# Patient Record
Sex: Female | Born: 1960 | Race: White | Hispanic: No | Marital: Married | Smoking: Current every day smoker
Health system: Southern US, Community
[De-identification: ages and names within clinical notes are randomized; demographics above are authoritative.]

## PROBLEM LIST (undated history)

## (undated) DIAGNOSIS — J449 Chronic obstructive pulmonary disease, unspecified: Secondary | ICD-10-CM

## (undated) DIAGNOSIS — I10 Essential (primary) hypertension: Secondary | ICD-10-CM

## (undated) DIAGNOSIS — E785 Hyperlipidemia, unspecified: Secondary | ICD-10-CM

## (undated) HISTORY — PX: ABDOMINAL HYSTERECTOMY: SHX81

## (undated) HISTORY — DX: Chronic obstructive pulmonary disease, unspecified: J44.9

## (undated) HISTORY — DX: Hyperlipidemia, unspecified: E78.5

## (undated) HISTORY — DX: Essential (primary) hypertension: I10

---

## 2005-06-01 ENCOUNTER — Emergency Department: Payer: Self-pay | Admitting: Emergency Medicine

## 2005-06-05 ENCOUNTER — Emergency Department: Payer: Self-pay | Admitting: Emergency Medicine

## 2006-03-26 ENCOUNTER — Emergency Department: Payer: Self-pay | Admitting: Internal Medicine

## 2006-03-26 ENCOUNTER — Other Ambulatory Visit: Payer: Self-pay

## 2006-05-24 ENCOUNTER — Emergency Department: Payer: Self-pay | Admitting: Emergency Medicine

## 2013-11-14 ENCOUNTER — Emergency Department: Payer: Self-pay | Admitting: Emergency Medicine

## 2017-09-21 ENCOUNTER — Other Ambulatory Visit: Payer: Self-pay | Admitting: Nurse Practitioner

## 2017-09-21 DIAGNOSIS — Z1231 Encounter for screening mammogram for malignant neoplasm of breast: Secondary | ICD-10-CM

## 2018-06-27 ENCOUNTER — Other Ambulatory Visit: Payer: Self-pay | Admitting: Nurse Practitioner

## 2018-06-27 DIAGNOSIS — Z1231 Encounter for screening mammogram for malignant neoplasm of breast: Secondary | ICD-10-CM

## 2018-07-14 ENCOUNTER — Encounter: Payer: Self-pay | Admitting: Radiology

## 2018-07-14 ENCOUNTER — Ambulatory Visit
Admission: RE | Admit: 2018-07-14 | Discharge: 2018-07-14 | Disposition: A | Discharge status: Home / Self Care | Payer: Medicare Other | Source: Ambulatory Visit | Attending: Nurse Practitioner | Admitting: Nurse Practitioner

## 2018-07-14 DIAGNOSIS — Z1231 Encounter for screening mammogram for malignant neoplasm of breast: Secondary | ICD-10-CM | POA: Insufficient documentation

## 2018-08-30 ENCOUNTER — Ambulatory Visit: Payer: Medicare Other | Admitting: Neurology

## 2019-05-04 ENCOUNTER — Other Ambulatory Visit: Payer: Self-pay | Admitting: Nurse Practitioner

## 2019-05-04 DIAGNOSIS — Z1231 Encounter for screening mammogram for malignant neoplasm of breast: Secondary | ICD-10-CM

## 2019-07-17 ENCOUNTER — Ambulatory Visit
Admission: RE | Admit: 2019-07-17 | Discharge: 2019-07-17 | Disposition: A | Discharge status: Home / Self Care | Payer: Medicare Other | Source: Ambulatory Visit | Attending: Nurse Practitioner | Admitting: Nurse Practitioner

## 2019-07-17 DIAGNOSIS — Z1231 Encounter for screening mammogram for malignant neoplasm of breast: Secondary | ICD-10-CM | POA: Insufficient documentation

## 2020-07-29 DIAGNOSIS — Z20822 Contact with and (suspected) exposure to covid-19: Secondary | ICD-10-CM | POA: Diagnosis not present

## 2020-08-11 DIAGNOSIS — R7303 Prediabetes: Secondary | ICD-10-CM | POA: Diagnosis not present

## 2020-08-11 DIAGNOSIS — E785 Hyperlipidemia, unspecified: Secondary | ICD-10-CM | POA: Diagnosis not present

## 2020-08-11 DIAGNOSIS — I10 Essential (primary) hypertension: Secondary | ICD-10-CM | POA: Diagnosis not present

## 2020-08-11 DIAGNOSIS — E559 Vitamin D deficiency, unspecified: Secondary | ICD-10-CM | POA: Diagnosis not present

## 2020-08-13 ENCOUNTER — Other Ambulatory Visit: Payer: Self-pay | Admitting: Nurse Practitioner

## 2020-08-13 DIAGNOSIS — I10 Essential (primary) hypertension: Secondary | ICD-10-CM | POA: Diagnosis not present

## 2020-08-13 DIAGNOSIS — Z1231 Encounter for screening mammogram for malignant neoplasm of breast: Secondary | ICD-10-CM

## 2020-08-13 DIAGNOSIS — E785 Hyperlipidemia, unspecified: Secondary | ICD-10-CM | POA: Diagnosis not present

## 2020-08-13 DIAGNOSIS — G43109 Migraine with aura, not intractable, without status migrainosus: Secondary | ICD-10-CM | POA: Diagnosis not present

## 2020-08-13 DIAGNOSIS — Z23 Encounter for immunization: Secondary | ICD-10-CM | POA: Diagnosis not present

## 2020-08-13 DIAGNOSIS — R7303 Prediabetes: Secondary | ICD-10-CM | POA: Diagnosis not present

## 2020-09-19 ENCOUNTER — Ambulatory Visit
Admission: RE | Admit: 2020-09-19 | Discharge: 2020-09-19 | Disposition: A | Discharge status: Home / Self Care | Payer: Medicare Other | Source: Ambulatory Visit | Attending: Nurse Practitioner | Admitting: Nurse Practitioner

## 2020-09-19 ENCOUNTER — Other Ambulatory Visit: Payer: Self-pay

## 2020-09-19 DIAGNOSIS — Z1231 Encounter for screening mammogram for malignant neoplasm of breast: Secondary | ICD-10-CM | POA: Insufficient documentation

## 2020-11-21 ENCOUNTER — Other Ambulatory Visit: Payer: Medicare Other

## 2020-12-09 DIAGNOSIS — I671 Cerebral aneurysm, nonruptured: Secondary | ICD-10-CM | POA: Diagnosis not present

## 2020-12-11 DIAGNOSIS — I671 Cerebral aneurysm, nonruptured: Secondary | ICD-10-CM | POA: Diagnosis not present

## 2020-12-12 DIAGNOSIS — E785 Hyperlipidemia, unspecified: Secondary | ICD-10-CM | POA: Diagnosis not present

## 2020-12-12 DIAGNOSIS — R7303 Prediabetes: Secondary | ICD-10-CM | POA: Diagnosis not present

## 2020-12-12 DIAGNOSIS — I10 Essential (primary) hypertension: Secondary | ICD-10-CM | POA: Diagnosis not present

## 2020-12-15 DIAGNOSIS — I671 Cerebral aneurysm, nonruptured: Secondary | ICD-10-CM | POA: Diagnosis not present

## 2020-12-15 DIAGNOSIS — I10 Essential (primary) hypertension: Secondary | ICD-10-CM | POA: Diagnosis not present

## 2020-12-15 DIAGNOSIS — G43009 Migraine without aura, not intractable, without status migrainosus: Secondary | ICD-10-CM | POA: Diagnosis not present

## 2020-12-15 DIAGNOSIS — E785 Hyperlipidemia, unspecified: Secondary | ICD-10-CM | POA: Diagnosis not present

## 2021-01-05 DIAGNOSIS — I10 Essential (primary) hypertension: Secondary | ICD-10-CM | POA: Diagnosis not present

## 2021-01-05 DIAGNOSIS — G4489 Other headache syndrome: Secondary | ICD-10-CM | POA: Diagnosis not present

## 2021-01-05 DIAGNOSIS — E785 Hyperlipidemia, unspecified: Secondary | ICD-10-CM | POA: Diagnosis not present

## 2021-01-05 DIAGNOSIS — Z0001 Encounter for general adult medical examination with abnormal findings: Secondary | ICD-10-CM | POA: Diagnosis not present

## 2021-06-16 DIAGNOSIS — E559 Vitamin D deficiency, unspecified: Secondary | ICD-10-CM | POA: Diagnosis not present

## 2021-06-16 DIAGNOSIS — E785 Hyperlipidemia, unspecified: Secondary | ICD-10-CM | POA: Diagnosis not present

## 2021-06-16 DIAGNOSIS — R7303 Prediabetes: Secondary | ICD-10-CM | POA: Diagnosis not present

## 2021-06-16 DIAGNOSIS — I10 Essential (primary) hypertension: Secondary | ICD-10-CM | POA: Diagnosis not present

## 2021-06-19 ENCOUNTER — Other Ambulatory Visit: Payer: Self-pay | Admitting: Nurse Practitioner

## 2021-06-19 DIAGNOSIS — I10 Essential (primary) hypertension: Secondary | ICD-10-CM | POA: Diagnosis not present

## 2021-06-19 DIAGNOSIS — G4489 Other headache syndrome: Secondary | ICD-10-CM | POA: Diagnosis not present

## 2021-06-19 DIAGNOSIS — Z1231 Encounter for screening mammogram for malignant neoplasm of breast: Secondary | ICD-10-CM

## 2021-06-19 DIAGNOSIS — E785 Hyperlipidemia, unspecified: Secondary | ICD-10-CM | POA: Diagnosis not present

## 2021-06-19 DIAGNOSIS — R7303 Prediabetes: Secondary | ICD-10-CM | POA: Diagnosis not present

## 2021-09-21 ENCOUNTER — Other Ambulatory Visit: Payer: Self-pay

## 2021-09-21 ENCOUNTER — Ambulatory Visit
Admission: RE | Admit: 2021-09-21 | Discharge: 2021-09-21 | Disposition: A | Discharge status: Home / Self Care | Payer: Medicare Other | Source: Ambulatory Visit | Attending: Nurse Practitioner | Admitting: Nurse Practitioner

## 2021-09-21 DIAGNOSIS — Z1231 Encounter for screening mammogram for malignant neoplasm of breast: Secondary | ICD-10-CM | POA: Insufficient documentation

## 2021-11-25 DIAGNOSIS — Z982 Presence of cerebrospinal fluid drainage device: Secondary | ICD-10-CM | POA: Diagnosis not present

## 2021-11-25 DIAGNOSIS — I6203 Nontraumatic chronic subdural hemorrhage: Secondary | ICD-10-CM | POA: Diagnosis not present

## 2021-11-25 DIAGNOSIS — G9389 Other specified disorders of brain: Secondary | ICD-10-CM | POA: Diagnosis not present

## 2021-11-25 DIAGNOSIS — I671 Cerebral aneurysm, nonruptured: Secondary | ICD-10-CM | POA: Diagnosis not present

## 2021-11-30 DIAGNOSIS — I671 Cerebral aneurysm, nonruptured: Secondary | ICD-10-CM | POA: Diagnosis not present

## 2021-12-06 DIAGNOSIS — E785 Hyperlipidemia, unspecified: Secondary | ICD-10-CM | POA: Diagnosis not present

## 2021-12-06 DIAGNOSIS — E559 Vitamin D deficiency, unspecified: Secondary | ICD-10-CM | POA: Diagnosis not present

## 2021-12-06 DIAGNOSIS — I10 Essential (primary) hypertension: Secondary | ICD-10-CM | POA: Diagnosis not present

## 2021-12-07 DIAGNOSIS — R7303 Prediabetes: Secondary | ICD-10-CM | POA: Diagnosis not present

## 2021-12-07 DIAGNOSIS — I10 Essential (primary) hypertension: Secondary | ICD-10-CM | POA: Diagnosis not present

## 2021-12-07 DIAGNOSIS — E785 Hyperlipidemia, unspecified: Secondary | ICD-10-CM | POA: Diagnosis not present

## 2021-12-29 DIAGNOSIS — E785 Hyperlipidemia, unspecified: Secondary | ICD-10-CM | POA: Diagnosis not present

## 2021-12-29 DIAGNOSIS — F17209 Nicotine dependence, unspecified, with unspecified nicotine-induced disorders: Secondary | ICD-10-CM | POA: Diagnosis not present

## 2021-12-29 DIAGNOSIS — R7303 Prediabetes: Secondary | ICD-10-CM | POA: Diagnosis not present

## 2021-12-29 DIAGNOSIS — I10 Essential (primary) hypertension: Secondary | ICD-10-CM | POA: Diagnosis not present

## 2022-01-27 DIAGNOSIS — E785 Hyperlipidemia, unspecified: Secondary | ICD-10-CM | POA: Diagnosis not present

## 2022-01-27 DIAGNOSIS — I10 Essential (primary) hypertension: Secondary | ICD-10-CM | POA: Diagnosis not present

## 2022-01-27 DIAGNOSIS — I671 Cerebral aneurysm, nonruptured: Secondary | ICD-10-CM | POA: Diagnosis not present

## 2022-01-27 DIAGNOSIS — R7303 Prediabetes: Secondary | ICD-10-CM | POA: Diagnosis not present

## 2022-01-27 DIAGNOSIS — F17209 Nicotine dependence, unspecified, with unspecified nicotine-induced disorders: Secondary | ICD-10-CM | POA: Diagnosis not present

## 2022-03-07 DIAGNOSIS — E785 Hyperlipidemia, unspecified: Secondary | ICD-10-CM | POA: Diagnosis not present

## 2022-03-07 DIAGNOSIS — I10 Essential (primary) hypertension: Secondary | ICD-10-CM | POA: Diagnosis not present

## 2022-03-07 DIAGNOSIS — E559 Vitamin D deficiency, unspecified: Secondary | ICD-10-CM | POA: Diagnosis not present

## 2022-04-12 DIAGNOSIS — I10 Essential (primary) hypertension: Secondary | ICD-10-CM | POA: Diagnosis not present

## 2022-04-12 DIAGNOSIS — R7303 Prediabetes: Secondary | ICD-10-CM | POA: Diagnosis not present

## 2022-04-12 DIAGNOSIS — E785 Hyperlipidemia, unspecified: Secondary | ICD-10-CM | POA: Diagnosis not present

## 2022-04-14 DIAGNOSIS — I10 Essential (primary) hypertension: Secondary | ICD-10-CM | POA: Diagnosis not present

## 2022-04-14 DIAGNOSIS — G4489 Other headache syndrome: Secondary | ICD-10-CM | POA: Diagnosis not present

## 2022-04-14 DIAGNOSIS — R5383 Other fatigue: Secondary | ICD-10-CM | POA: Diagnosis not present

## 2022-04-14 DIAGNOSIS — R7303 Prediabetes: Secondary | ICD-10-CM | POA: Diagnosis not present

## 2022-04-14 DIAGNOSIS — E785 Hyperlipidemia, unspecified: Secondary | ICD-10-CM | POA: Diagnosis not present

## 2022-08-13 DIAGNOSIS — R5383 Other fatigue: Secondary | ICD-10-CM | POA: Diagnosis not present

## 2022-08-13 DIAGNOSIS — R7303 Prediabetes: Secondary | ICD-10-CM | POA: Diagnosis not present

## 2022-08-13 DIAGNOSIS — I10 Essential (primary) hypertension: Secondary | ICD-10-CM | POA: Diagnosis not present

## 2022-08-13 DIAGNOSIS — E785 Hyperlipidemia, unspecified: Secondary | ICD-10-CM | POA: Diagnosis not present

## 2022-08-16 DIAGNOSIS — E785 Hyperlipidemia, unspecified: Secondary | ICD-10-CM | POA: Diagnosis not present

## 2022-08-16 DIAGNOSIS — Z23 Encounter for immunization: Secondary | ICD-10-CM | POA: Diagnosis not present

## 2022-08-16 DIAGNOSIS — I10 Essential (primary) hypertension: Secondary | ICD-10-CM | POA: Diagnosis not present

## 2022-08-16 DIAGNOSIS — R7303 Prediabetes: Secondary | ICD-10-CM | POA: Diagnosis not present

## 2022-08-16 DIAGNOSIS — F17209 Nicotine dependence, unspecified, with unspecified nicotine-induced disorders: Secondary | ICD-10-CM | POA: Diagnosis not present

## 2022-08-16 DIAGNOSIS — K219 Gastro-esophageal reflux disease without esophagitis: Secondary | ICD-10-CM | POA: Diagnosis not present

## 2022-08-17 ENCOUNTER — Other Ambulatory Visit: Payer: Self-pay | Admitting: Nurse Practitioner

## 2022-08-17 DIAGNOSIS — Z1231 Encounter for screening mammogram for malignant neoplasm of breast: Secondary | ICD-10-CM

## 2022-09-22 ENCOUNTER — Ambulatory Visit
Admission: RE | Admit: 2022-09-22 | Discharge: 2022-09-22 | Disposition: A | Discharge status: Home / Self Care | Payer: Medicare Other | Source: Ambulatory Visit | Attending: Nurse Practitioner | Admitting: Nurse Practitioner

## 2022-09-22 DIAGNOSIS — Z1231 Encounter for screening mammogram for malignant neoplasm of breast: Secondary | ICD-10-CM | POA: Diagnosis not present

## 2022-12-17 ENCOUNTER — Other Ambulatory Visit: Payer: Self-pay | Admitting: Nurse Practitioner

## 2022-12-28 ENCOUNTER — Other Ambulatory Visit: Payer: Self-pay | Admitting: Nurse Practitioner

## 2023-01-14 ENCOUNTER — Other Ambulatory Visit: Payer: 59

## 2023-01-14 ENCOUNTER — Other Ambulatory Visit: Payer: Self-pay | Admitting: Nurse Practitioner

## 2023-01-14 DIAGNOSIS — I10 Essential (primary) hypertension: Secondary | ICD-10-CM | POA: Diagnosis not present

## 2023-01-14 DIAGNOSIS — E785 Hyperlipidemia, unspecified: Secondary | ICD-10-CM | POA: Diagnosis not present

## 2023-01-14 DIAGNOSIS — R7303 Prediabetes: Secondary | ICD-10-CM | POA: Diagnosis not present

## 2023-01-15 LAB — COMPREHENSIVE METABOLIC PANEL
ALT: 16 IU/L (ref 0–32)
AST: 21 IU/L (ref 0–40)
Albumin/Globulin Ratio: 2.1 (ref 1.2–2.2)
Albumin: 4.4 g/dL (ref 3.9–4.9)
Alkaline Phosphatase: 67 IU/L (ref 44–121)
BUN/Creatinine Ratio: 11 — ABNORMAL LOW (ref 12–28)
BUN: 8 mg/dL (ref 8–27)
Bilirubin Total: 0.3 mg/dL (ref 0.0–1.2)
CO2: 24 mmol/L (ref 20–29)
Calcium: 9.4 mg/dL (ref 8.7–10.3)
Chloride: 103 mmol/L (ref 96–106)
Creatinine, Ser: 0.75 mg/dL (ref 0.57–1.00)
Globulin, Total: 2.1 g/dL (ref 1.5–4.5)
Glucose: 107 mg/dL — ABNORMAL HIGH (ref 70–99)
Potassium: 4.2 mmol/L (ref 3.5–5.2)
Sodium: 141 mmol/L (ref 134–144)
Total Protein: 6.5 g/dL (ref 6.0–8.5)
eGFR: 89 mL/min/{1.73_m2} (ref >59)

## 2023-01-15 LAB — HGB A1C W/O EAG: Hgb A1c MFr Bld: 6.1 % — ABNORMAL HIGH (ref 4.8–5.6)

## 2023-01-15 LAB — LIPID PANEL W/O CHOL/HDL RATIO
Cholesterol, Total: 106 mg/dL (ref 100–199)
HDL: 46 mg/dL (ref >39)
LDL Chol Calc (NIH): 44 mg/dL (ref 0–99)
Triglycerides: 82 mg/dL (ref 0–149)
VLDL Cholesterol Cal: 16 mg/dL (ref 5–40)

## 2023-01-15 LAB — TSH: TSH: 1.23 u[IU]/mL (ref 0.450–4.500)

## 2023-01-17 ENCOUNTER — Ambulatory Visit (INDEPENDENT_AMBULATORY_CARE_PROVIDER_SITE_OTHER): Payer: 59 | Admitting: Nurse Practitioner

## 2023-01-17 VITALS — BP 138/82 | HR 85 | Ht 66.0 in | Wt 119.0 lb

## 2023-01-17 DIAGNOSIS — F17209 Nicotine dependence, unspecified, with unspecified nicotine-induced disorders: Secondary | ICD-10-CM | POA: Diagnosis not present

## 2023-01-17 DIAGNOSIS — R7303 Prediabetes: Secondary | ICD-10-CM

## 2023-01-17 DIAGNOSIS — G8929 Other chronic pain: Secondary | ICD-10-CM | POA: Insufficient documentation

## 2023-01-17 DIAGNOSIS — R519 Headache, unspecified: Secondary | ICD-10-CM

## 2023-01-17 NOTE — Progress Notes (Signed)
Established Patient Office Visit  Subjective:  Patient ID: Beth Ray, female    DOB: 1961/05/13  Age: 62 y.o. MRN: [id number]  Chief Complaint  Patient presents with   Follow-up    5 Months with Labs    4 month follow up, reviewed fasting labs with patient and everything looks great, except for A1c at 6.1%.  Patient having headaches. Roselyn Meier was effective, now less effective.  Pt will call to NeuroSurg to see when next scan is due.     No past medical history on file.  Past Surgical History:  Procedure Laterality Date   ABDOMINAL HYSTERECTOMY      Social History   Socioeconomic History   Marital status: Married    Spouse name: Not on file   Number of children: Not on file   Years of education: Not on file   Highest education level: Not on file  Occupational History   Not on file  Tobacco Use   Smoking status: Not on file   Smokeless tobacco: Not on file  Substance and Sexual Activity   Alcohol use: Not on file   Drug use: Not on file   Sexual activity: Not on file  Other Topics Concern   Not on file  Social History Narrative   Not on file   Social Determinants of Health   Financial Resource Strain: Not on file  Food Insecurity: Not on file  Transportation Needs: Not on file  Physical Activity: Not on file  Stress: Not on file  Social Connections: Not on file  Intimate Partner Violence: Not on file    Family History  Problem Relation Age of Onset   Breast cancer Neg Hx     Not on File  Review of Systems  Constitutional: Negative.   HENT: Negative.    Eyes: Negative.   Respiratory: Negative.    Cardiovascular: Negative.   Gastrointestinal:  Positive for heartburn.  Genitourinary: Negative.   Musculoskeletal: Negative.   Skin: Negative.   Neurological:  Positive for headaches.  Endo/Heme/Allergies: Negative.   Psychiatric/Behavioral: Negative.         Objective:   BP 138/82   Pulse 85   Ht '5\' 6"'$  (1.676 m)   Wt 119 lb (54  kg)   SpO2 96%   BMI 19.21 kg/m   Vitals:   01/17/23 0930  BP: 138/82  Pulse: 85  Height: '5\' 6"'$  (1.676 m)  Weight: 119 lb (54 kg)  SpO2: 96%  BMI (Calculated): 19.22    Physical Exam Vitals reviewed.  Constitutional:      Appearance: Normal appearance.  HENT:     Head: Normocephalic.     Nose: Nose normal.     Mouth/Throat:     Mouth: Mucous membranes are moist.  Eyes:     Pupils: Pupils are equal, round, and reactive to light.  Cardiovascular:     Rate and Rhythm: Normal rate and regular rhythm.  Pulmonary:     Effort: Pulmonary effort is normal.     Breath sounds: Normal breath sounds.  Abdominal:     General: Bowel sounds are normal.     Palpations: Abdomen is soft.  Musculoskeletal:        General: Normal range of motion.     Cervical back: Normal range of motion and neck supple.  Skin:    General: Skin is warm and dry.  Neurological:     Mental Status: She is alert and oriented to person, place, and time.  Psychiatric:        Mood and Affect: Mood normal.        Behavior: Behavior normal.      No results found for any visits on 01/17/23.  Recent Results (from the past 2160 hour(s))  Comprehensive metabolic panel     Status: Abnormal   Collection Time: 01/14/23  8:39 AM  Result Value Ref Range   Glucose 107 (H) 70 - 99 mg/dL   BUN 8 8 - 27 mg/dL   Creatinine, Ser 0.75 0.57 - 1.00 mg/dL   eGFR 89 >59 mL/min/1.73   BUN/Creatinine Ratio 11 (L) 12 - 28   Sodium 141 134 - 144 mmol/L   Potassium 4.2 3.5 - 5.2 mmol/L   Chloride 103 96 - 106 mmol/L   CO2 24 20 - 29 mmol/L   Calcium 9.4 8.7 - 10.3 mg/dL   Total Protein 6.5 6.0 - 8.5 g/dL   Albumin 4.4 3.9 - 4.9 g/dL   Globulin, Total 2.1 1.5 - 4.5 g/dL   Albumin/Globulin Ratio 2.1 1.2 - 2.2   Bilirubin Total 0.3 0.0 - 1.2 mg/dL   Alkaline Phosphatase 67 44 - 121 IU/L   AST 21 0 - 40 IU/L   ALT 16 0 - 32 IU/L  Lipid Panel w/o Chol/HDL Ratio     Status: None   Collection Time: 01/14/23  8:39 AM   Result Value Ref Range   Cholesterol, Total 106 100 - 199 mg/dL   Triglycerides 82 0 - 149 mg/dL   HDL 46 >39 mg/dL   VLDL Cholesterol Cal 16 5 - 40 mg/dL   LDL Chol Calc (NIH) 44 0 - 99 mg/dL  Hgb A1c w/o eAG     Status: Abnormal   Collection Time: 01/14/23  8:39 AM  Result Value Ref Range   Hgb A1c MFr Bld 6.1 (H) 4.8 - 5.6 %    Comment:          Prediabetes: 5.7 - 6.4          Diabetes: >6.4          Glycemic control for adults with diabetes: <7.0   TSH     Status: None   Collection Time: 01/14/23  8:39 AM  Result Value Ref Range   TSH 1.230 0.450 - 4.500 uIU/mL      Assessment & Plan:   Problem List Items Addressed This Visit       Other   Chronic nonintractable headache - Primary   Relevant Medications   UBRELVY 50 MG TABS   Prediabetes   Nicotine dependence with nicotine-induced disorder    No follow-ups on file.   Total time spent: 35 minutes  Evern Bio, NP  01/17/2023

## 2023-01-17 NOTE — Patient Instructions (Signed)
1) Cut back on conc sweets and carbs 2) Pt will contact NeuroSurg for increased headaches 3) Follow up appt in 4 months, fasting labs prior

## 2023-04-25 DIAGNOSIS — I609 Nontraumatic subarachnoid hemorrhage, unspecified: Secondary | ICD-10-CM | POA: Diagnosis not present

## 2023-04-25 DIAGNOSIS — I671 Cerebral aneurysm, nonruptured: Secondary | ICD-10-CM | POA: Diagnosis not present

## 2023-05-16 ENCOUNTER — Other Ambulatory Visit: Payer: 59

## 2023-05-16 ENCOUNTER — Other Ambulatory Visit: Payer: Self-pay | Admitting: Nurse Practitioner

## 2023-05-16 DIAGNOSIS — I10 Essential (primary) hypertension: Secondary | ICD-10-CM | POA: Diagnosis not present

## 2023-05-16 DIAGNOSIS — R7303 Prediabetes: Secondary | ICD-10-CM | POA: Diagnosis not present

## 2023-05-16 DIAGNOSIS — E785 Hyperlipidemia, unspecified: Secondary | ICD-10-CM | POA: Diagnosis not present

## 2023-05-17 LAB — LIPID PANEL W/O CHOL/HDL RATIO
Cholesterol, Total: 113 mg/dL (ref 100–199)
HDL: 42 mg/dL (ref >39)
LDL Chol Calc (NIH): 51 mg/dL (ref 0–99)
Triglycerides: 107 mg/dL (ref 0–149)
VLDL Cholesterol Cal: 20 mg/dL (ref 5–40)

## 2023-05-17 LAB — COMPREHENSIVE METABOLIC PANEL
ALT: 15 IU/L (ref 0–32)
AST: 18 IU/L (ref 0–40)
Albumin: 4.3 g/dL (ref 3.9–4.9)
Alkaline Phosphatase: 66 IU/L (ref 44–121)
BUN/Creatinine Ratio: 11 — ABNORMAL LOW (ref 12–28)
BUN: 8 mg/dL (ref 8–27)
Bilirubin Total: 0.5 mg/dL (ref 0.0–1.2)
CO2: 24 mmol/L (ref 20–29)
Calcium: 9.7 mg/dL (ref 8.7–10.3)
Chloride: 106 mmol/L (ref 96–106)
Creatinine, Ser: 0.73 mg/dL (ref 0.57–1.00)
Globulin, Total: 1.9 g/dL (ref 1.5–4.5)
Glucose: 106 mg/dL — ABNORMAL HIGH (ref 70–99)
Potassium: 4.5 mmol/L (ref 3.5–5.2)
Sodium: 143 mmol/L (ref 134–144)
Total Protein: 6.2 g/dL (ref 6.0–8.5)
eGFR: 92 mL/min/{1.73_m2} (ref >59)

## 2023-05-17 LAB — HGB A1C W/O EAG: Hgb A1c MFr Bld: 6 % — ABNORMAL HIGH (ref 4.8–5.6)

## 2023-05-17 LAB — TSH: TSH: 1.04 u[IU]/mL (ref 0.450–4.500)

## 2023-05-19 ENCOUNTER — Ambulatory Visit (INDEPENDENT_AMBULATORY_CARE_PROVIDER_SITE_OTHER): Payer: 59 | Admitting: Cardiology

## 2023-05-19 ENCOUNTER — Encounter: Payer: Self-pay | Admitting: Cardiology

## 2023-05-19 VITALS — BP 116/74 | HR 82 | Ht 65.0 in | Wt 112.6 lb

## 2023-05-19 DIAGNOSIS — E782 Mixed hyperlipidemia: Secondary | ICD-10-CM

## 2023-05-19 DIAGNOSIS — I671 Cerebral aneurysm, nonruptured: Secondary | ICD-10-CM

## 2023-05-19 DIAGNOSIS — I10 Essential (primary) hypertension: Secondary | ICD-10-CM

## 2023-05-19 DIAGNOSIS — F17219 Nicotine dependence, cigarettes, with unspecified nicotine-induced disorders: Secondary | ICD-10-CM | POA: Diagnosis not present

## 2023-05-19 DIAGNOSIS — R7303 Prediabetes: Secondary | ICD-10-CM

## 2023-05-19 DIAGNOSIS — Z1211 Encounter for screening for malignant neoplasm of colon: Secondary | ICD-10-CM | POA: Diagnosis not present

## 2023-05-19 MED ORDER — GABAPENTIN 300 MG PO CAPS: 300.0000 mg | ORAL_CAPSULE | Freq: Every day | ORAL | 1 refills | Status: DC

## 2023-05-19 MED ORDER — ROSUVASTATIN CALCIUM 20 MG PO TABS: 20.0000 mg | ORAL_TABLET | Freq: Every day | ORAL | 1 refills | Status: DC

## 2023-05-19 MED ORDER — UBRELVY 100 MG PO TABS: 100.0000 mg | ORAL_TABLET | Freq: Every day | ORAL | 11 refills | Status: DC

## 2023-05-19 MED ORDER — CLOPIDOGREL BISULFATE 75 MG PO TABS: 75.0000 mg | ORAL_TABLET | Freq: Every day | ORAL | 1 refills | Status: DC

## 2023-05-19 NOTE — Progress Notes (Signed)
Established Patient Office Visit  Subjective:  Patient ID: Beth Ray, female    DOB: 1961/03/25  Age: 62 y.o. MRN: [id number]  Chief Complaint  Patient presents with   Follow-up    4 month follow up    Patient in office for 4 month follow up, discuss recent lab work. Patient doing well. Headaches better controlled. Seeing neurosurgery regularly and has a CT scan of the head scheduled for 05/22/2023. Other wise she is feeling well.  Recent blood work. Cholesterol well controlled, continue statin. TSH normal. Pre diabetic, patient to continue working on diet and exercise.  Patient continues to smoke. Discussed importance of quitting.  Mammogram 09/2022. Will order at next visit.  Cologuard 02/2019. Will order.    No other concerns at this time.   History reviewed. No pertinent past medical history.  Past Surgical History:  Procedure Laterality Date   ABDOMINAL HYSTERECTOMY      Social History   Socioeconomic History   Marital status: Married    Spouse name: Not on file   Number of children: Not on file   Years of education: Not on file   Highest education level: Not on file  Occupational History   Not on file  Tobacco Use   Smoking status: Every Day    Current packs/day: 1.00    Average packs/day: 1 pack/day for 49.5 years (49.5 ttl pk-yrs)    Types: Cigarettes    Start date: 84   Smokeless tobacco: Never  Substance and Sexual Activity   Alcohol use: Not on file   Drug use: Not on file   Sexual activity: Not on file  Other Topics Concern   Not on file  Social History Narrative   Not on file   Social Determinants of Health   Financial Resource Strain: Not on file  Food Insecurity: No Food Insecurity (09/13/2019)   Received from [facility]   Hunger Vital Sign    Worried About Running Out of Food in the Last Year: Never true    Ran Out of Food in the Last Year: Never true  Transportation Needs: Not on file  Physical Activity: Not on file   Stress: Not on file  Social Connections: Not on file  Intimate Partner Violence: Not on file    Family History  Problem Relation Age of Onset   Breast cancer Neg Hx     No Known Allergies  Review of Systems  Constitutional: Negative.   HENT: Negative.    Eyes: Negative.   Respiratory: Negative.  Negative for shortness of breath.   Cardiovascular: Negative.  Negative for chest pain.  Gastrointestinal: Negative.  Negative for abdominal pain, constipation and diarrhea.  Genitourinary: Negative.   Musculoskeletal:  Negative for joint pain and myalgias.  Skin: Negative.   Neurological: Negative.  Negative for dizziness and headaches.  Endo/Heme/Allergies: Negative.   All other systems reviewed and are negative.      Objective:   BP 116/74   Pulse 82   Ht 5\' 5"  (1.651 m)   Wt 112 lb 9.6 oz (51.1 kg)   SpO2 98%   BMI 18.74 kg/m   Vitals:   05/19/23 0902  BP: 116/74  Pulse: 82  Height: 5\' 5"  (1.651 m)  Weight: 112 lb 9.6 oz (51.1 kg)  SpO2: 98%  BMI (Calculated): 18.74    Physical Exam Vitals and nursing note reviewed.  Constitutional:      Appearance: Normal appearance. She is normal weight.  HENT:  Head: Normocephalic and atraumatic.     Nose: Nose normal.     Mouth/Throat:     Mouth: Mucous membranes are moist.     Pharynx: Oropharynx is clear.  Eyes:     Extraocular Movements: Extraocular movements intact.     Conjunctiva/sclera: Conjunctivae normal.     Pupils: Pupils are equal, round, and reactive to light.  Cardiovascular:     Rate and Rhythm: Normal rate and regular rhythm.     Pulses: Normal pulses.     Heart sounds: Normal heart sounds.  Pulmonary:     Effort: Pulmonary effort is normal.     Breath sounds: Normal breath sounds.  Abdominal:     General: Abdomen is flat. Bowel sounds are normal.     Palpations: Abdomen is soft.  Musculoskeletal:        General: Normal range of motion.     Cervical back: Normal range of motion.     Left  lower leg: No edema.  Skin:    General: Skin is warm and dry.  Neurological:     General: No focal deficit present.     Mental Status: She is alert and oriented to person, place, and time.  Psychiatric:        Mood and Affect: Mood normal.        Behavior: Behavior normal.        Thought Content: Thought content normal.        Judgment: Judgment normal.     No results found for any visits on 05/19/23.  Recent Results (from the past 2160 hour(s))  Comprehensive metabolic panel     Status: Abnormal   Collection Time: 05/16/23  8:40 AM  Result Value Ref Range   Glucose 106 (H) 70 - 99 mg/dL   BUN 8 8 - 27 mg/dL   Creatinine, Ser 5.18 0.57 - 1.00 mg/dL   eGFR 92 >84 ZY/SAY/3.01   BUN/Creatinine Ratio 11 (L) 12 - 28   Sodium 143 134 - 144 mmol/L   Potassium 4.5 3.5 - 5.2 mmol/L   Chloride 106 96 - 106 mmol/L   CO2 24 20 - 29 mmol/L   Calcium 9.7 8.7 - 10.3 mg/dL   Total Protein 6.2 6.0 - 8.5 g/dL   Albumin 4.3 3.9 - 4.9 g/dL   Globulin, Total 1.9 1.5 - 4.5 g/dL   Bilirubin Total 0.5 0.0 - 1.2 mg/dL   Alkaline Phosphatase 66 44 - 121 IU/L   AST 18 0 - 40 IU/L   ALT 15 0 - 32 IU/L  Lipid Panel w/o Chol/HDL Ratio     Status: None   Collection Time: 05/16/23  8:40 AM  Result Value Ref Range   Cholesterol, Total 113 100 - 199 mg/dL   Triglycerides 601 0 - 149 mg/dL   HDL 42 >09 mg/dL   VLDL Cholesterol Cal 20 5 - 40 mg/dL   LDL Chol Calc (NIH) 51 0 - 99 mg/dL  Hgb N2T w/o eAG     Status: Abnormal   Collection Time: 05/16/23  8:40 AM  Result Value Ref Range   Hgb A1c MFr Bld 6.0 (H) 4.8 - 5.6 %    Comment:          Prediabetes: 5.7 - 6.4          Diabetes: >6.4          Glycemic control for adults with diabetes: <7.0   TSH     Status: None   Collection Time:  05/16/23  8:40 AM  Result Value Ref Range   TSH 1.040 0.450 - 4.500 uIU/mL      Assessment & Plan:  Continue follow up with neurosurgeon.  Continue to work on diet and exercise.  Smoking cessation. Mammogram  at next visit. Cologuard ordered.   Problem List Items Addressed This Visit       Cardiovascular and Mediastinum   Cerebral aneurysm, nonruptured   Relevant Medications   rosuvastatin (CRESTOR) 20 MG tablet   Primary hypertension   Relevant Medications   rosuvastatin (CRESTOR) 20 MG tablet   Other Relevant Orders   CMP14+EGFR     Other   Prediabetes - Primary   Relevant Orders   TSH   Hemoglobin A1c   Nicotine dependence with nicotine-induced disorder   Mixed hyperlipidemia   Relevant Medications   rosuvastatin (CRESTOR) 20 MG tablet   Other Relevant Orders   Lipid Profile   TSH   Other Visit Diagnoses     Colon cancer screening       Relevant Orders   Cologuard       Return in about 4 months (around 09/19/2023) for with fasting blood work prior.   Total time spent: 25 minutes  [employer], NP  05/19/2023   This document may have been prepared by Dragon Voice Recognition software and as such may include unintentional dictation errors.

## 2023-05-22 DIAGNOSIS — I671 Cerebral aneurysm, nonruptured: Secondary | ICD-10-CM | POA: Diagnosis not present

## 2023-05-23 DIAGNOSIS — I671 Cerebral aneurysm, nonruptured: Secondary | ICD-10-CM | POA: Diagnosis not present

## 2023-05-30 IMAGING — MG MM DIGITAL SCREENING BILAT W/ TOMO AND CAD
8 series · 9 of 24 positions shown · non-contrast
Comparison: Previous exam(s).

CLINICAL DATA: Screening.

EXAM:
DIGITAL SCREENING BILATERAL MAMMOGRAM WITH TOMOSYNTHESIS AND CAD
TECHNIQUE: Bilateral screening digital craniocaudal and mediolateral oblique
mammograms were obtained. Bilateral screening digital breast
tomosynthesis was performed. The images were evaluated with
computer-aided detection.

[R CC synth-2D]
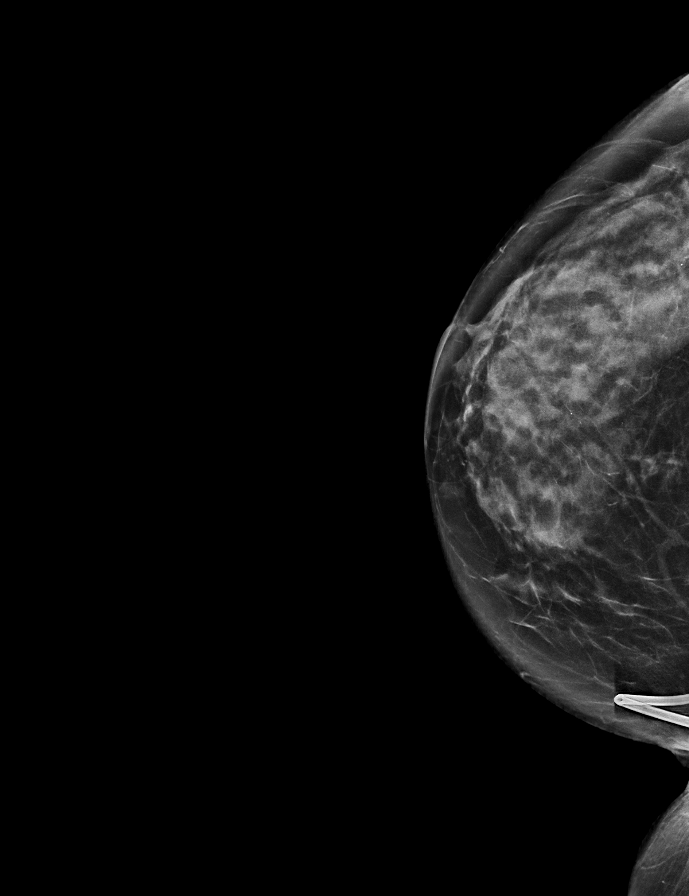

[L MLO synth-2D]
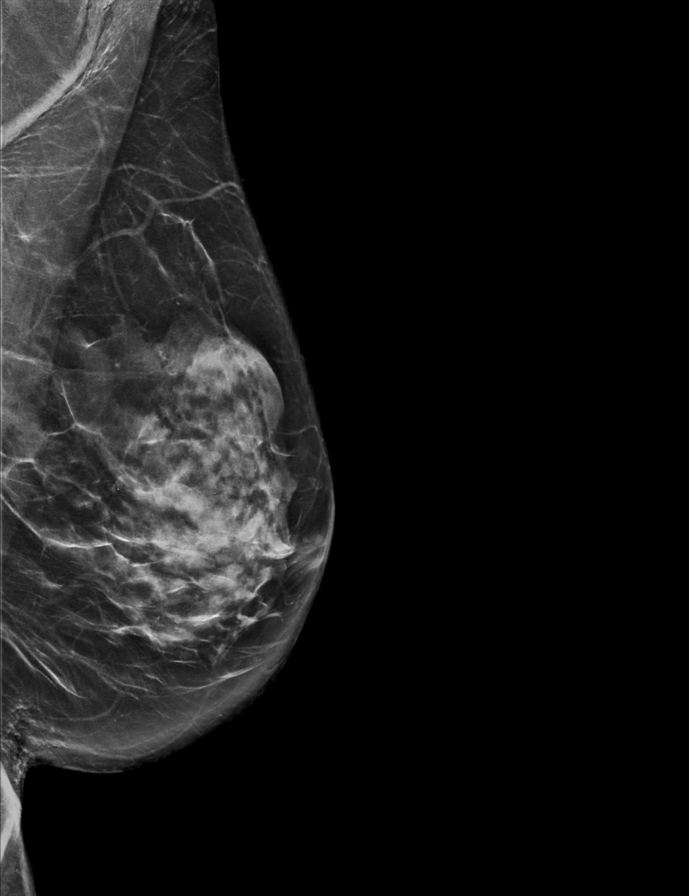

[R MLO synth-2D]
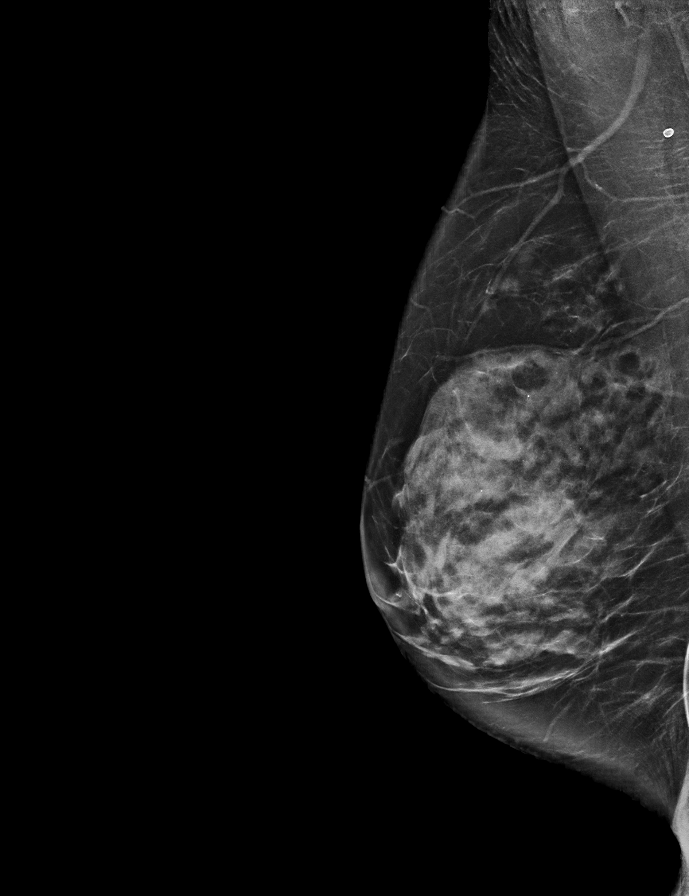

[L CC synth-2D]
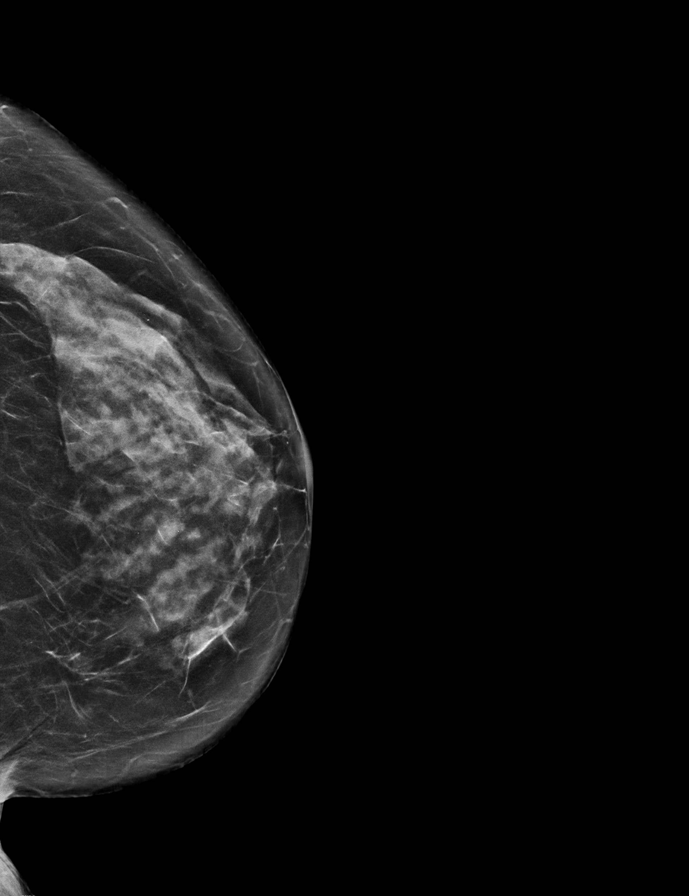

[L CC tomo · 2 of 66 frames shown]
[frame 22/66]
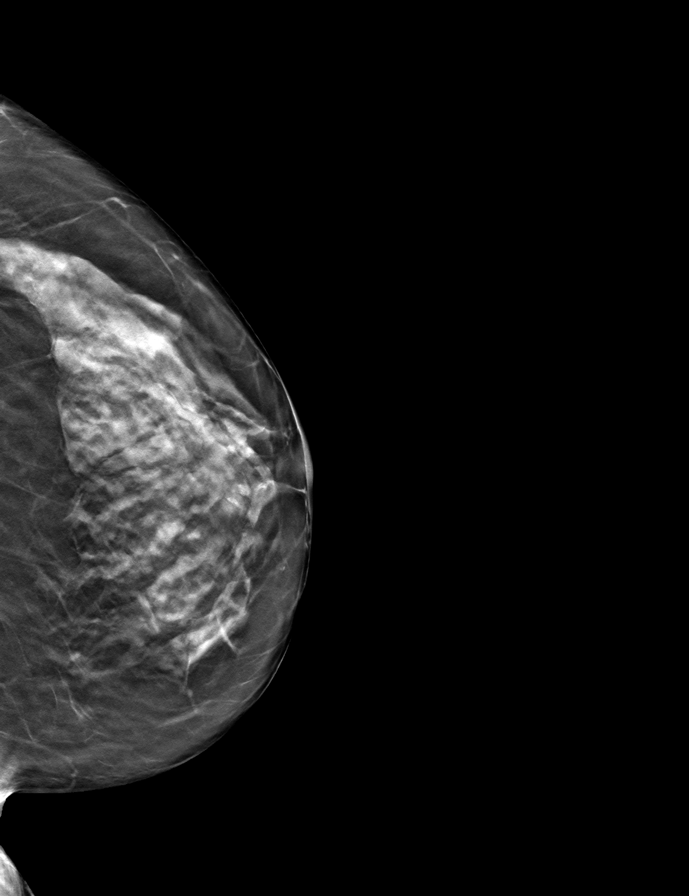
[frame 33/66]
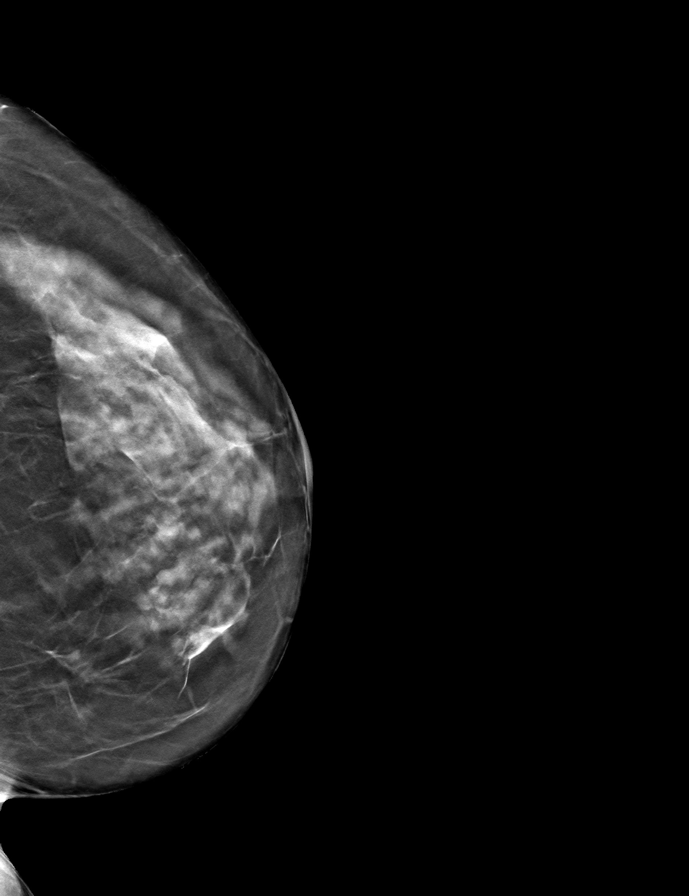

[L MLO tomo · tomo slice 35/69.0]
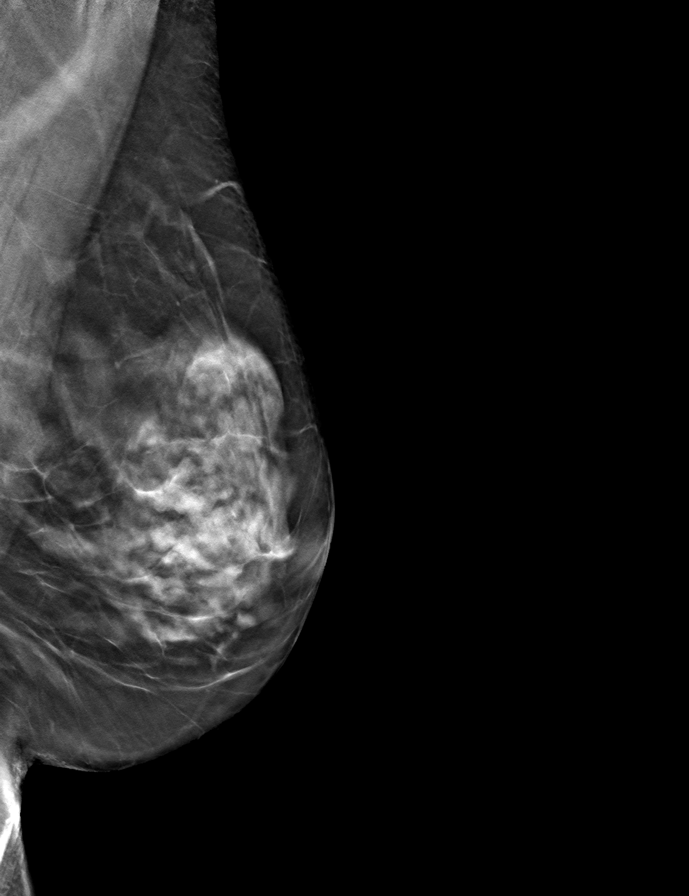

[R CC tomo · tomo slice 33/66.0]
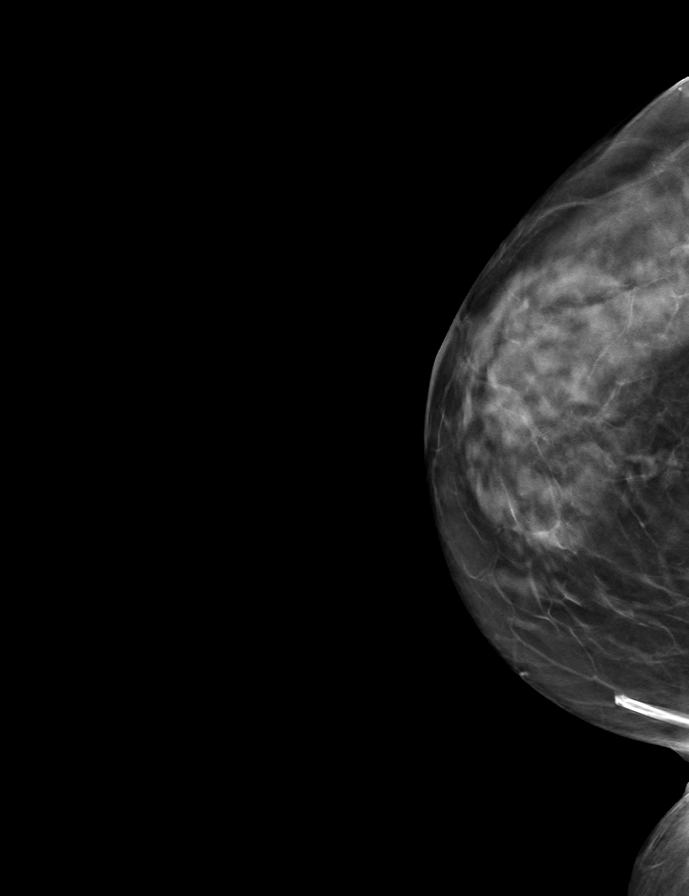

[R MLO tomo · tomo slice 37/73.0]
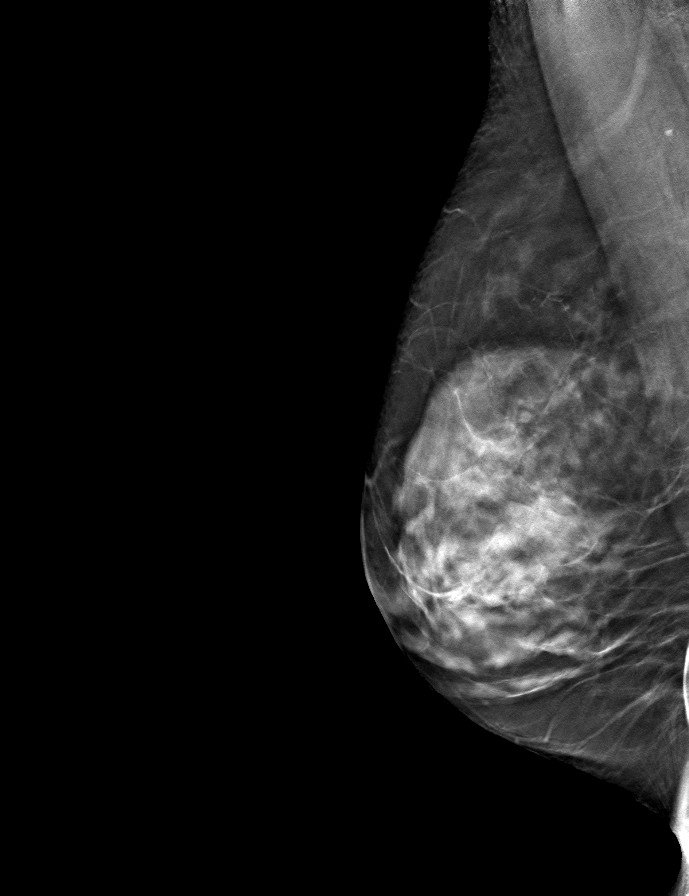

[9 of 24 positions shown; findings below may reference images not displayed]

ACR Breast Density Category d: The breast tissue is extremely dense,
which lowers the sensitivity of mammography
FINDINGS: There are no findings suspicious for malignancy.
IMPRESSION: No mammographic evidence of malignancy. A result letter of this
screening mammogram will be mailed directly to the patient.

RECOMMENDATION:
Screening mammogram in one year. (Code:TA-V-WV9)

BI-RADS CATEGORY  1: Negative.

## 2023-06-02 ENCOUNTER — Telehealth: Payer: Self-pay

## 2023-06-02 NOTE — Telephone Encounter (Signed)
Patient Beth Ray asking for cough meds we need to call and find out what she needs the cough meds for

## 2023-06-03 ENCOUNTER — Ambulatory Visit: Payer: 59 | Admitting: Cardiology

## 2023-06-03 ENCOUNTER — Encounter: Payer: Self-pay | Admitting: Cardiology

## 2023-06-03 VITALS — BP 148/88 | HR 93 | Temp 98.2°F | Ht 65.0 in | Wt 110.4 lb

## 2023-06-03 DIAGNOSIS — J441 Chronic obstructive pulmonary disease with (acute) exacerbation: Secondary | ICD-10-CM | POA: Diagnosis not present

## 2023-06-03 DIAGNOSIS — R6889 Other general symptoms and signs: Secondary | ICD-10-CM | POA: Insufficient documentation

## 2023-06-03 DIAGNOSIS — F17219 Nicotine dependence, cigarettes, with unspecified nicotine-induced disorders: Secondary | ICD-10-CM

## 2023-06-03 LAB — POCT XPERT XPRESS SARS COVID-2/FLU/RSV
FLU A: NEGATIVE
FLU B: NEGATIVE
RSV RNA, PCR: NEGATIVE
SARS Coronavirus 2: NEGATIVE

## 2023-06-03 MED ORDER — PREDNISONE 20 MG PO TABS: 40.0000 mg | ORAL_TABLET | Freq: Every day | ORAL | 0 refills | Status: AC

## 2023-06-03 MED ORDER — ALBUTEROL SULFATE HFA 108 (90 BASE) MCG/ACT IN AERS: 2.0000 | INHALATION_SPRAY | Freq: Four times a day (QID) | RESPIRATORY_TRACT | 2 refills | Status: DC | PRN

## 2023-06-03 MED ORDER — BENZONATATE 100 MG PO CAPS: 200.0000 mg | ORAL_CAPSULE | Freq: Three times a day (TID) | ORAL | 1 refills | Status: DC | PRN

## 2023-06-03 MED ORDER — AZITHROMYCIN 250 MG PO TABS: ORAL_TABLET | ORAL | 0 refills | Status: DC

## 2023-06-03 MED ORDER — FLUTICASONE PROPIONATE 50 MCG/ACT NA SUSP: 1.0000 | Freq: Every day | NASAL | 6 refills | Status: DC

## 2023-06-03 NOTE — Progress Notes (Unsigned)
Established Patient Office Visit  Subjective:  Patient ID: Beth Ray, female    DOB: 1961-01-05  Age: 62 y.o. MRN: [id number]  Chief Complaint  Patient presents with   Cough    Dry, croupy cough    Patient in office complaining of a cough. States it started about a week ago. Cough is non-productive, nasal discharge and post nasal drip, sore throat. Patient states she has tried Robitussin OTC and Tessalon pearls with no relief. Patient admits Tessalon pearls may be expired. Results of swab pending.   Cough This is a new problem. The current episode started 1 to 4 weeks ago. The problem has been unchanged. The problem occurs every few minutes. The cough is Non-productive. Associated symptoms include postnasal drip, rhinorrhea and a sore throat. Pertinent negatives include no chest pain, chills, ear pain, fever, headaches, myalgias, nasal congestion or shortness of breath. The symptoms are aggravated by lying down. Risk factors for lung disease include smoking/tobacco exposure. She has tried body position changes, OTC cough suppressant, rest and prescription cough suppressant for the symptoms. The treatment provided no relief. Her past medical history is significant for COPD.    No other concerns at this time.   History reviewed. No pertinent past medical history.  Past Surgical History:  Procedure Laterality Date   ABDOMINAL HYSTERECTOMY      Social History   Socioeconomic History   Marital status: Married    Spouse name: Not on file   Number of children: Not on file   Years of education: Not on file   Highest education level: Not on file  Occupational History   Not on file  Tobacco Use   Smoking status: Every Day    Current packs/day: 1.00    Average packs/day: 1 pack/day for 49.6 years (49.6 ttl pk-yrs)    Types: Cigarettes    Start date: 25   Smokeless tobacco: Never  Substance and Sexual Activity   Alcohol use: Not on file   Drug use: Not on file   Sexual  activity: Not on file  Other Topics Concern   Not on file  Social History Narrative   Not on file   Social Determinants of Health   Financial Resource Strain: Not on file  Food Insecurity: No Food Insecurity (09/13/2019)   Received from [facility]   Hunger Vital Sign    Worried About Running Out of Food in the Last Year: Never true    Ran Out of Food in the Last Year: Never true  Transportation Needs: Not on file  Physical Activity: Not on file  Stress: Not on file  Social Connections: Not on file  Intimate Partner Violence: Not on file    Family History  Problem Relation Age of Onset   Breast cancer Neg Hx     No Known Allergies  Review of Systems  Constitutional: Negative.  Negative for chills and fever.  HENT:  Positive for postnasal drip, rhinorrhea and sore throat. Negative for congestion, ear discharge, ear pain and sinus pain.   Eyes: Negative.   Respiratory:  Positive for cough. Negative for shortness of breath.   Cardiovascular: Negative.  Negative for chest pain.  Gastrointestinal: Negative.  Negative for abdominal pain, constipation and diarrhea.  Genitourinary: Negative.   Musculoskeletal:  Negative for joint pain and myalgias.  Skin: Negative.   Neurological: Negative.  Negative for dizziness and headaches.  Endo/Heme/Allergies: Negative.   All other systems reviewed and are negative.  Objective:   BP (!) 148/88   Pulse 93   Temp 98.2 F (36.8 C) (Tympanic)   Ht 5\' 5"  (1.651 m)   Wt 110 lb 6.4 oz (50.1 kg)   SpO2 97%   BMI 18.37 kg/m   Vitals:   06/03/23 1057  BP: (!) 148/88  Pulse: 93  Temp: 98.2 F (36.8 C)  Height: 5\' 5"  (1.651 m)  Weight: 110 lb 6.4 oz (50.1 kg)  SpO2: 97%  TempSrc: Tympanic  BMI (Calculated): 18.37    Physical Exam Vitals and nursing note reviewed.  Constitutional:      Appearance: Normal appearance. She is normal weight.  HENT:     Head: Normocephalic and atraumatic.     Nose: Nose  normal.     Mouth/Throat:     Mouth: Mucous membranes are moist.  Eyes:     Extraocular Movements: Extraocular movements intact.     Conjunctiva/sclera: Conjunctivae normal.     Pupils: Pupils are equal, round, and reactive to light.  Cardiovascular:     Rate and Rhythm: Normal rate and regular rhythm.     Pulses: Normal pulses.     Heart sounds: Normal heart sounds.  Pulmonary:     Effort: Pulmonary effort is normal.     Breath sounds: Normal breath sounds.  Abdominal:     General: Abdomen is flat. Bowel sounds are normal.     Palpations: Abdomen is soft.  Musculoskeletal:        General: Normal range of motion.     Cervical back: Normal range of motion.  Skin:    General: Skin is warm and dry.  Neurological:     General: No focal deficit present.     Mental Status: She is alert and oriented to person, place, and time.  Psychiatric:        Mood and Affect: Mood normal.        Behavior: Behavior normal.        Thought Content: Thought content normal.        Judgment: Judgment normal.      No results found for any visits on 06/03/23.  Recent Results (from the past 2160 hour(s))  Comprehensive metabolic panel     Status: Abnormal   Collection Time: 05/16/23  8:40 AM  Result Value Ref Range   Glucose 106 (H) 70 - 99 mg/dL   BUN 8 8 - 27 mg/dL   Creatinine, Ser 1.61 0.57 - 1.00 mg/dL   eGFR 92 >09 UE/AVW/0.98   BUN/Creatinine Ratio 11 (L) 12 - 28   Sodium 143 134 - 144 mmol/L   Potassium 4.5 3.5 - 5.2 mmol/L   Chloride 106 96 - 106 mmol/L   CO2 24 20 - 29 mmol/L   Calcium 9.7 8.7 - 10.3 mg/dL   Total Protein 6.2 6.0 - 8.5 g/dL   Albumin 4.3 3.9 - 4.9 g/dL   Globulin, Total 1.9 1.5 - 4.5 g/dL   Bilirubin Total 0.5 0.0 - 1.2 mg/dL   Alkaline Phosphatase 66 44 - 121 IU/L   AST 18 0 - 40 IU/L   ALT 15 0 - 32 IU/L  Lipid Panel w/o Chol/HDL Ratio     Status: None   Collection Time: 05/16/23  8:40 AM  Result Value Ref Range   Cholesterol, Total 113 100 - 199 mg/dL    Triglycerides 119 0 - 149 mg/dL   HDL 42 >14 mg/dL   VLDL Cholesterol Cal 20 5 - 40 mg/dL  LDL Chol Calc (NIH) 51 0 - 99 mg/dL  Hgb U9W w/o eAG     Status: Abnormal   Collection Time: 05/16/23  8:40 AM  Result Value Ref Range   Hgb A1c MFr Bld 6.0 (H) 4.8 - 5.6 %    Comment:          Prediabetes: 5.7 - 6.4          Diabetes: >6.4          Glycemic control for adults with diabetes: <7.0   TSH     Status: None   Collection Time: 05/16/23  8:40 AM  Result Value Ref Range   TSH 1.040 0.450 - 4.500 uIU/mL      Assessment & Plan:  OTC Delsym cough syrup 2. Albuterol inhaler, rinse mouth after each use 3. Antihistamine such as OTC Zyrtec or Claritin 4. Tessalon pearls  5. Prednisone 40 mg for 5 days 6. Z-pack 7. Mucinex DM 8. Nasal Spray 9. Call office on Monday if no improvement.   Problem List Items Addressed This Visit       Respiratory   Chronic obstructive pulmonary disease with acute exacerbation (HCC)   Relevant Medications   albuterol (VENTOLIN HFA) 108 (90 Base) MCG/ACT inhaler   benzonatate (TESSALON PERLES) 100 MG capsule   predniSONE (DELTASONE) 20 MG tablet   azithromycin (ZITHROMAX) 250 MG tablet   fluticasone (FLONASE) 50 MCG/ACT nasal spray     Other   Nicotine dependence with nicotine-induced disorder   Flu-like symptoms - Primary   Relevant Orders   POCT XPERT XPRESS SARS COVID-2/FLU/RSV    Return if symptoms worsen or fail to improve.   Total time spent: 30 minutes  Browning Southwood, NP  06/03/2023   This document may have been prepared by Dragon Voice Recognition software and as such may include unintentional dictation errors.

## 2023-06-03 NOTE — Patient Instructions (Signed)
OTC Delsym cough syrup Albuterol inhaler, rinse mouth after each use Antihistamine such as OTC Zyrtec or Claritin Tessalon pearls  Prednisone 40 mg for 5 days Z-pack Mucinex DM Nasal Spray

## 2023-06-23 NOTE — Progress Notes (Signed)
Spoke with pt who will verbalized understanding.

## 2023-09-15 ENCOUNTER — Other Ambulatory Visit: Payer: 59

## 2023-09-15 DIAGNOSIS — I10 Essential (primary) hypertension: Secondary | ICD-10-CM

## 2023-09-15 DIAGNOSIS — R7303 Prediabetes: Secondary | ICD-10-CM

## 2023-09-15 DIAGNOSIS — E782 Mixed hyperlipidemia: Secondary | ICD-10-CM

## 2023-09-16 LAB — CMP14+EGFR
ALT: 11 [IU]/L (ref 0–32)
AST: 14 [IU]/L (ref 0–40)
Albumin: 4.3 g/dL (ref 3.9–4.9)
Alkaline Phosphatase: 68 [IU]/L (ref 44–121)
BUN/Creatinine Ratio: 15 (ref 12–28)
BUN: 10 mg/dL (ref 8–27)
Bilirubin Total: 0.4 mg/dL (ref 0.0–1.2)
CO2: 22 mmol/L (ref 20–29)
Calcium: 9.7 mg/dL (ref 8.7–10.3)
Chloride: 103 mmol/L (ref 96–106)
Creatinine, Ser: 0.67 mg/dL (ref 0.57–1.00)
Globulin, Total: 2.4 g/dL (ref 1.5–4.5)
Glucose: 100 mg/dL — ABNORMAL HIGH (ref 70–99)
Potassium: 4.5 mmol/L (ref 3.5–5.2)
Sodium: 139 mmol/L (ref 134–144)
Total Protein: 6.7 g/dL (ref 6.0–8.5)
eGFR: 98 mL/min/{1.73_m2} (ref >59)

## 2023-09-16 LAB — LIPID PANEL
Chol/HDL Ratio: 2.3 ratio (ref 0.0–4.4)
Cholesterol, Total: 115 mg/dL (ref 100–199)
HDL: 50 mg/dL (ref >39)
LDL Chol Calc (NIH): 49 mg/dL (ref 0–99)
Triglycerides: 81 mg/dL (ref 0–149)
VLDL Cholesterol Cal: 16 mg/dL (ref 5–40)

## 2023-09-16 LAB — HEMOGLOBIN A1C
Est. average glucose Bld gHb Est-mCnc: 128 mg/dL
Hgb A1c MFr Bld: 6.1 % — ABNORMAL HIGH (ref 4.8–5.6)

## 2023-09-16 LAB — TSH: TSH: 2.16 u[IU]/mL (ref 0.450–4.500)

## 2023-09-19 ENCOUNTER — Encounter: Payer: Self-pay | Admitting: Cardiology

## 2023-09-19 ENCOUNTER — Ambulatory Visit (INDEPENDENT_AMBULATORY_CARE_PROVIDER_SITE_OTHER): Payer: 59 | Admitting: Cardiology

## 2023-09-19 VITALS — BP 140/88 | HR 85 | Ht 65.0 in | Wt 108.4 lb

## 2023-09-19 DIAGNOSIS — I10 Essential (primary) hypertension: Secondary | ICD-10-CM | POA: Diagnosis not present

## 2023-09-19 DIAGNOSIS — R7303 Prediabetes: Secondary | ICD-10-CM | POA: Diagnosis not present

## 2023-09-19 DIAGNOSIS — F17219 Nicotine dependence, cigarettes, with unspecified nicotine-induced disorders: Secondary | ICD-10-CM | POA: Diagnosis not present

## 2023-09-19 DIAGNOSIS — E782 Mixed hyperlipidemia: Secondary | ICD-10-CM | POA: Diagnosis not present

## 2023-09-19 MED ORDER — UBRELVY 100 MG PO TABS: 100.0000 mg | ORAL_TABLET | Freq: Every day | ORAL | 11 refills | Status: DC

## 2023-09-19 NOTE — Progress Notes (Signed)
Established Patient Office Visit  Subjective:  Patient ID: Beth Ray, female    DOB: 01-12-1961  Age: 62 y.o. MRN: [id number]  Chief Complaint  Patient presents with   Follow-up    4 month follow up    Patient in office for 4 month follow up, discuss recent lab results. Patient doing well. No complaints today. Discussed recent lab results. LDL at goal. Hgb A1c tending up. Discussed decreasing sugar and starch intake.  Up to date on mammogram.  Cologuard 08/2023 negative per patient. Results not available.  Patient is a long time smoker, low dose CT scan of chest ordered.     No other concerns at this time.   Past Medical History:  Diagnosis Date   COPD (chronic obstructive pulmonary disease) (HCC)    Hyperlipidemia    Hypertension     Past Surgical History:  Procedure Laterality Date   ABDOMINAL HYSTERECTOMY      Social History   Socioeconomic History   Marital status: Married    Spouse name: Not on file   Number of children: Not on file   Years of education: Not on file   Highest education level: Not on file  Occupational History   Not on file  Tobacco Use   Smoking status: Every Day    Current packs/day: 1.00    Average packs/day: 1 pack/day for 49.9 years (49.9 ttl pk-yrs)    Types: Cigarettes    Start date: 24   Smokeless tobacco: Never  Substance and Sexual Activity   Alcohol use: Not on file   Drug use: Not on file   Sexual activity: Not on file  Other Topics Concern   Not on file  Social History Narrative   Not on file   Social Determinants of Health   Financial Resource Strain: Not on file  Food Insecurity: No Food Insecurity (09/13/2019)   Received from [facility], [facility]   Hunger Vital Sign    Worried About Running Out of Food in the Last Year: Never true    Ran Out of Food in the Last Year: Never true  Transportation Needs: Not on file  Physical Activity: Not on file  Stress: Not on file  Social Connections:  Not on file  Intimate Partner Violence: Not on file    Family History  Problem Relation Age of Onset   Breast cancer Neg Hx     No Known Allergies  Review of Systems  Constitutional: Negative.   HENT: Negative.    Eyes: Negative.   Respiratory: Negative.  Negative for shortness of breath.   Cardiovascular: Negative.  Negative for chest pain.  Gastrointestinal: Negative.  Negative for abdominal pain, constipation and diarrhea.  Genitourinary: Negative.   Musculoskeletal:  Negative for joint pain and myalgias.  Skin: Negative.   Neurological: Negative.  Negative for dizziness and headaches.  Endo/Heme/Allergies: Negative.   All other systems reviewed and are negative.      Objective:   BP (!) 140/88   Pulse 85   Ht 5\' 5"  (1.651 m)   Wt 108 lb 6.4 oz (49.2 kg)   SpO2 96%   BMI 18.04 kg/m   Vitals:   09/19/23 0917  BP: (!) 140/88  Pulse: 85  Height: 5\' 5"  (1.651 m)  Weight: 108 lb 6.4 oz (49.2 kg)  SpO2: 96%  BMI (Calculated): 18.04    Physical Exam Vitals and nursing note reviewed.  Constitutional:      Appearance: Normal appearance.  She is normal weight.  HENT:     Head: Normocephalic and atraumatic.     Nose: Nose normal.     Mouth/Throat:     Mouth: Mucous membranes are moist.  Eyes:     Extraocular Movements: Extraocular movements intact.     Conjunctiva/sclera: Conjunctivae normal.     Pupils: Pupils are equal, round, and reactive to light.  Cardiovascular:     Rate and Rhythm: Normal rate and regular rhythm.     Pulses: Normal pulses.     Heart sounds: Normal heart sounds.  Pulmonary:     Effort: Pulmonary effort is normal.     Breath sounds: Normal breath sounds.  Abdominal:     General: Abdomen is flat. Bowel sounds are normal.     Palpations: Abdomen is soft.  Musculoskeletal:        General: Normal range of motion.     Cervical back: Normal range of motion.  Skin:    General: Skin is warm and dry.  Neurological:     General: No focal  deficit present.     Mental Status: She is alert and oriented to person, place, and time.  Psychiatric:        Mood and Affect: Mood normal.        Behavior: Behavior normal.        Thought Content: Thought content normal.        Judgment: Judgment normal.      No results found for any visits on 09/19/23.  Recent Results (from the past 2160 hour(s))  TSH     Status: None   Collection Time: 09/15/23  8:39 AM  Result Value Ref Range   TSH 2.160 0.450 - 4.500 uIU/mL  Lipid Profile     Status: None   Collection Time: 09/15/23  8:39 AM  Result Value Ref Range   Cholesterol, Total 115 100 - 199 mg/dL   Triglycerides 81 0 - 149 mg/dL   HDL 50 >16 mg/dL   VLDL Cholesterol Cal 16 5 - 40 mg/dL   LDL Chol Calc (NIH) 49 0 - 99 mg/dL   Chol/HDL Ratio 2.3 0.0 - 4.4 ratio    Comment:                                   T. Chol/HDL Ratio                                             Men  Women                               1/2 Avg.Risk  3.4    3.3                                   Avg.Risk  5.0    4.4                                2X Avg.Risk  9.6    7.1  3X Avg.Risk 23.4   11.0   Hemoglobin A1c     Status: Abnormal   Collection Time: 09/15/23  8:39 AM  Result Value Ref Range   Hgb A1c MFr Bld 6.1 (H) 4.8 - 5.6 %    Comment:          Prediabetes: 5.7 - 6.4          Diabetes: >6.4          Glycemic control for adults with diabetes: <7.0    Est. average glucose Bld gHb Est-mCnc 128 mg/dL  [id number]     Status: Abnormal   Collection Time: 09/15/23  8:39 AM  Result Value Ref Range   Glucose 100 (H) 70 - 99 mg/dL   BUN 10 8 - 27 mg/dL   Creatinine, Ser 2.44 0.57 - 1.00 mg/dL   eGFR 98 >01 UU/VOZ/3.66   BUN/Creatinine Ratio 15 12 - 28   Sodium 139 134 - 144 mmol/L   Potassium 4.5 3.5 - 5.2 mmol/L   Chloride 103 96 - 106 mmol/L   CO2 22 20 - 29 mmol/L   Calcium 9.7 8.7 - 10.3 mg/dL   Total Protein 6.7 6.0 - 8.5 g/dL   Albumin 4.3 3.9 - 4.9 g/dL    Globulin, Total 2.4 1.5 - 4.5 g/dL   Bilirubin Total 0.4 0.0 - 1.2 mg/dL   Alkaline Phosphatase 68 44 - 121 IU/L   AST 14 0 - 40 IU/L   ALT 11 0 - 32 IU/L      Assessment & Plan:  Follow a strict diabetic diet and exercise. Low dose CT scan for lung cancer screening.   Problem List Items Addressed This Visit       Cardiovascular and Mediastinum   Primary hypertension - Primary     Other   Prediabetes   Nicotine dependence with nicotine-induced disorder   Relevant Orders   CT CHEST LUNG CA SCREEN LOW DOSE W/O CM   Mixed hyperlipidemia    Return in about 4 months (around 01/17/2024) for with lab work prior.   Total time spent: 25 minutes  Google, NP  09/19/2023   This document may have been prepared by Dragon Voice Recognition software and as such may include unintentional dictation errors.

## 2023-09-27 ENCOUNTER — Other Ambulatory Visit: Payer: 59

## 2023-10-04 ENCOUNTER — Ambulatory Visit: Payer: 59

## 2023-10-04 DIAGNOSIS — F17219 Nicotine dependence, cigarettes, with unspecified nicotine-induced disorders: Secondary | ICD-10-CM | POA: Diagnosis not present

## 2023-10-07 ENCOUNTER — Other Ambulatory Visit: Payer: Self-pay | Admitting: Cardiology

## 2023-10-11 NOTE — Progress Notes (Signed)
Patient notified

## 2023-12-06 ENCOUNTER — Other Ambulatory Visit: Payer: Self-pay | Admitting: Cardiology

## 2024-01-13 ENCOUNTER — Other Ambulatory Visit

## 2024-01-13 DIAGNOSIS — I10 Essential (primary) hypertension: Secondary | ICD-10-CM

## 2024-01-13 DIAGNOSIS — R7303 Prediabetes: Secondary | ICD-10-CM

## 2024-01-13 DIAGNOSIS — E782 Mixed hyperlipidemia: Secondary | ICD-10-CM

## 2024-01-14 LAB — CMP14+EGFR
ALT: 9 IU/L (ref 0–32)
AST: 15 IU/L (ref 0–40)
Albumin: 4.5 g/dL (ref 3.9–4.9)
Alkaline Phosphatase: 79 IU/L (ref 44–121)
BUN/Creatinine Ratio: 13 (ref 12–28)
BUN: 9 mg/dL (ref 8–27)
Bilirubin Total: 0.4 mg/dL (ref 0.0–1.2)
CO2: 24 mmol/L (ref 20–29)
Calcium: 9.6 mg/dL (ref 8.7–10.3)
Chloride: 101 mmol/L (ref 96–106)
Creatinine, Ser: 0.72 mg/dL (ref 0.57–1.00)
Globulin, Total: 2.3 g/dL (ref 1.5–4.5)
Glucose: 102 mg/dL — ABNORMAL HIGH (ref 70–99)
Potassium: 4.6 mmol/L (ref 3.5–5.2)
Sodium: 140 mmol/L (ref 134–144)
Total Protein: 6.8 g/dL (ref 6.0–8.5)
eGFR: 93 mL/min/{1.73_m2} (ref >59)

## 2024-01-14 LAB — LIPID PANEL
Chol/HDL Ratio: 2.7 ratio (ref 0.0–4.4)
Cholesterol, Total: 118 mg/dL (ref 100–199)
HDL: 43 mg/dL (ref >39)
LDL Chol Calc (NIH): 53 mg/dL (ref 0–99)
Triglycerides: 123 mg/dL (ref 0–149)
VLDL Cholesterol Cal: 22 mg/dL (ref 5–40)

## 2024-01-14 LAB — HEMOGLOBIN A1C
Est. average glucose Bld gHb Est-mCnc: 126 mg/dL
Hgb A1c MFr Bld: 6 % — ABNORMAL HIGH (ref 4.8–5.6)

## 2024-01-14 LAB — TSH: TSH: 3.53 u[IU]/mL (ref 0.450–4.500)

## 2024-01-17 ENCOUNTER — Ambulatory Visit (INDEPENDENT_AMBULATORY_CARE_PROVIDER_SITE_OTHER): Payer: 59 | Admitting: Cardiology

## 2024-01-17 ENCOUNTER — Encounter: Payer: Self-pay | Admitting: Cardiology

## 2024-01-17 VITALS — BP 120/70 | HR 90 | Ht 65.0 in | Wt 111.0 lb

## 2024-01-17 DIAGNOSIS — R7303 Prediabetes: Secondary | ICD-10-CM | POA: Diagnosis not present

## 2024-01-17 DIAGNOSIS — I10 Essential (primary) hypertension: Secondary | ICD-10-CM

## 2024-01-17 DIAGNOSIS — F17219 Nicotine dependence, cigarettes, with unspecified nicotine-induced disorders: Secondary | ICD-10-CM | POA: Diagnosis not present

## 2024-01-17 DIAGNOSIS — E782 Mixed hyperlipidemia: Secondary | ICD-10-CM

## 2024-01-17 NOTE — Progress Notes (Signed)
 Established Patient Office Visit  Subjective:  Patient ID: Beth Ray, female    DOB: [birth year]  Age: 63 y.o. MRN: [id number]  Chief Complaint  Patient presents with   Follow-up    4 Months Follow Up    Patient in office for 4 month follow up, discuss recent lab work. Patient doing well, no complaints today. Continues to smoke, not interested in quitting.  Up to date on routine maintenance exams.  Discussed recent lab work. Hgb A1c stable. LDL at goal.  Continue same medications.     No other concerns at this time.   Past Medical History:  Diagnosis Date   COPD (chronic obstructive pulmonary disease) (HCC)    Hyperlipidemia    Hypertension     Past Surgical History:  Procedure Laterality Date   ABDOMINAL HYSTERECTOMY      Social History   Socioeconomic History   Marital status: Married    Spouse name: Not on file   Number of children: Not on file   Years of education: Not on file   Highest education level: Not on file  Occupational History   Not on file  Tobacco Use   Smoking status: Every Day    Current packs/day: 1.00    Average packs/day: 1 pack/day for 50.2 years (50.2 ttl pk-yrs)    Types: Cigarettes    Start date: 70   Smokeless tobacco: Never  Substance and Sexual Activity   Alcohol use: Not on file   Drug use: Not on file   Sexual activity: Not on file  Other Topics Concern   Not on file  Social History Narrative   Not on file   Social Drivers of Health   Financial Resource Strain: Not on file  Food Insecurity: No Food Insecurity (09/13/2019)   Received from [facility], [facility]   Hunger Vital Sign    Worried About Running Out of Food in the Last Year: Never true    Ran Out of Food in the Last Year: Never true  Transportation Needs: Not on file  Physical Activity: Not on file  Stress: Not on file  Social Connections: Not on file  Intimate Partner Violence: Not on file    Family History  Problem Relation Age of  Onset   Breast cancer Neg Hx     No Known Allergies  Outpatient Medications Prior to Visit  Medication Sig   Cholecalciferol (VITAMIN D3) 10 MCG (400 UNIT) tablet Take 400 Units by mouth daily.   clopidogrel (PLAVIX) 75 MG tablet Take 1 tablet (75 mg total) by mouth daily.   gabapentin (NEURONTIN) 300 MG capsule TAKE 1 CAPSULE BY MOUTH ONCE DAILY   GARLIC PO Take by mouth.   rosuvastatin (CRESTOR) 20 MG tablet TAKE 1 TABLET BY MOUTH NIGHTLY   Turmeric 400 MG CAPS Take by mouth.   Ubrogepant (UBRELVY) 100 MG TABS Take 1 tablet (100 mg total) by mouth daily.   valACYclovir (VALTREX) 1000 MG tablet Take by mouth. (Patient not taking: Reported on 09/19/2023)   No facility-administered medications prior to visit.    Review of Systems  Constitutional: Negative.   HENT: Negative.    Eyes: Negative.   Respiratory: Negative.  Negative for shortness of breath.   Cardiovascular: Negative.  Negative for chest pain.  Gastrointestinal: Negative.  Negative for abdominal pain, constipation and diarrhea.  Genitourinary: Negative.   Musculoskeletal:  Negative for joint pain and myalgias.  Skin: Negative.   Neurological: Negative.  Negative for  dizziness and headaches.  Endo/Heme/Allergies: Negative.   All other systems reviewed and are negative.      Objective:   BP 120/70   Pulse 90   Ht 5\' 5"  (1.651 m)   Wt 111 lb (50.3 kg)   SpO2 98%   BMI 18.47 kg/m   Vitals:   01/17/24 0905  BP: 120/70  Pulse: 90  Height: 5\' 5"  (1.651 m)  Weight: 111 lb (50.3 kg)  SpO2: 98%  BMI (Calculated): 18.47    Physical Exam Vitals and nursing note reviewed.  Constitutional:      Appearance: Normal appearance. She is normal weight.  HENT:     Head: Normocephalic and atraumatic.     Nose: Nose normal.     Mouth/Throat:     Mouth: Mucous membranes are moist.  Eyes:     Extraocular Movements: Extraocular movements intact.     Conjunctiva/sclera: Conjunctivae normal.     Pupils: Pupils are  equal, round, and reactive to light.  Cardiovascular:     Rate and Rhythm: Normal rate and regular rhythm.     Pulses: Normal pulses.     Heart sounds: Normal heart sounds.  Pulmonary:     Effort: Pulmonary effort is normal.     Breath sounds: Normal breath sounds.  Abdominal:     General: Abdomen is flat. Bowel sounds are normal.     Palpations: Abdomen is soft.  Musculoskeletal:        General: Normal range of motion.     Cervical back: Normal range of motion.  Skin:    General: Skin is warm and dry.  Neurological:     General: No focal deficit present.     Mental Status: She is alert and oriented to person, place, and time.  Psychiatric:        Mood and Affect: Mood normal.        Behavior: Behavior normal.        Thought Content: Thought content normal.        Judgment: Judgment normal.      No results found for any visits on 01/17/24.  Recent Results (from the past 2160 hours)  Hemoglobin A1c     Status: Abnormal   Collection Time: 01/13/24  9:04 AM  Result Value Ref Range   Hgb A1c MFr Bld 6.0 (H) 4.8 - 5.6 %    Comment:          Prediabetes: 5.7 - 6.4          Diabetes: >6.4          Glycemic control for adults with diabetes: <7.0    Est. average glucose Bld gHb Est-mCnc 126 mg/dL  TSH     Status: None   Collection Time: 01/13/24  9:04 AM  Result Value Ref Range   TSH 3.530 0.450 - 4.500 uIU/mL  CMP14+EGFR     Status: Abnormal   Collection Time: 01/13/24  9:04 AM  Result Value Ref Range   Glucose 102 (H) 70 - 99 mg/dL   BUN 9 8 - 27 mg/dL   Creatinine, Ser 6.04 0.57 - 1.00 mg/dL   eGFR 93 >54 UJ/WJX/9.14   BUN/Creatinine Ratio 13 12 - 28   Sodium 140 134 - 144 mmol/L   Potassium 4.6 3.5 - 5.2 mmol/L   Chloride 101 96 - 106 mmol/L   CO2 24 20 - 29 mmol/L   Calcium 9.6 8.7 - 10.3 mg/dL   Total Protein 6.8 6.0 - 8.5 g/dL  Albumin 4.5 3.9 - 4.9 g/dL   Globulin, Total 2.3 1.5 - 4.5 g/dL   Bilirubin Total 0.4 0.0 - 1.2 mg/dL   Alkaline Phosphatase 79 44  - 121 IU/L   AST 15 0 - 40 IU/L   ALT 9 0 - 32 IU/L  Lipid panel     Status: None   Collection Time: 01/13/24  9:04 AM  Result Value Ref Range   Cholesterol, Total 118 100 - 199 mg/dL   Triglycerides 161 0 - 149 mg/dL   HDL 43 >09 mg/dL   VLDL Cholesterol Cal 22 5 - 40 mg/dL   LDL Chol Calc (NIH) 53 0 - 99 mg/dL   Chol/HDL Ratio 2.7 0.0 - 4.4 ratio    Comment:                                   T. Chol/HDL Ratio                                             Men  Women                               1/2 Avg.Risk  3.4    3.3                                   Avg.Risk  5.0    4.4                                2X Avg.Risk  9.6    7.1                                3X Avg.Risk 23.4   11.0       Assessment & Plan:  Continue same medications.   Problem List Items Addressed This Visit       Cardiovascular and Mediastinum   Primary hypertension - Primary     Other   Prediabetes   Nicotine dependence with nicotine-induced disorder   Mixed hyperlipidemia    Return in about 4 months (around 05/18/2024) for with fasting labs prior.   Total time spent: 25 minutes  Google, NP  01/17/2024   This document may have been prepared by Dragon Voice Recognition software and as such may include unintentional dictation errors.

## 2024-02-11 ENCOUNTER — Other Ambulatory Visit: Payer: Self-pay | Admitting: Cardiology

## 2024-04-28 ENCOUNTER — Other Ambulatory Visit: Payer: Self-pay | Admitting: Cardiology

## 2024-05-15 ENCOUNTER — Other Ambulatory Visit

## 2024-05-15 DIAGNOSIS — R7303 Prediabetes: Secondary | ICD-10-CM

## 2024-05-15 DIAGNOSIS — I10 Essential (primary) hypertension: Secondary | ICD-10-CM

## 2024-05-15 DIAGNOSIS — E782 Mixed hyperlipidemia: Secondary | ICD-10-CM

## 2024-05-16 ENCOUNTER — Ambulatory Visit: Payer: Self-pay | Admitting: Cardiology

## 2024-05-16 LAB — CMP14+EGFR
ALT: 15 IU/L (ref 0–32)
AST: 15 IU/L (ref 0–40)
Albumin: 4.4 g/dL (ref 3.9–4.9)
Alkaline Phosphatase: 58 IU/L (ref 44–121)
BUN/Creatinine Ratio: 18 (ref 12–28)
BUN: 13 mg/dL (ref 8–27)
Bilirubin Total: <0.2 mg/dL (ref 0.0–1.2)
CO2: 22 mmol/L (ref 20–29)
Calcium: 9.4 mg/dL (ref 8.7–10.3)
Chloride: 102 mmol/L (ref 96–106)
Creatinine, Ser: 0.72 mg/dL (ref 0.57–1.00)
Globulin, Total: 1.8 g/dL (ref 1.5–4.5)
Glucose: 105 mg/dL — ABNORMAL HIGH (ref 70–99)
Potassium: 4.2 mmol/L (ref 3.5–5.2)
Sodium: 139 mmol/L (ref 134–144)
Total Protein: 6.2 g/dL (ref 6.0–8.5)
eGFR: 93 mL/min/1.73 (ref >59)

## 2024-05-16 LAB — HEMOGLOBIN A1C
Est. average glucose Bld gHb Est-mCnc: 126 mg/dL
Hgb A1c MFr Bld: 6 % — ABNORMAL HIGH (ref 4.8–5.6)

## 2024-05-16 LAB — LIPID PANEL
Chol/HDL Ratio: 2.5 ratio (ref 0.0–4.4)
Cholesterol, Total: 112 mg/dL (ref 100–199)
HDL: 45 mg/dL (ref >39)
LDL Chol Calc (NIH): 50 mg/dL (ref 0–99)
Triglycerides: 89 mg/dL (ref 0–149)
VLDL Cholesterol Cal: 17 mg/dL (ref 5–40)

## 2024-05-16 LAB — TSH: TSH: 2.47 u[IU]/mL (ref 0.450–4.500)

## 2024-05-18 ENCOUNTER — Ambulatory Visit (INDEPENDENT_AMBULATORY_CARE_PROVIDER_SITE_OTHER): Admitting: Cardiology

## 2024-05-18 ENCOUNTER — Encounter: Payer: Self-pay | Admitting: Cardiology

## 2024-05-18 VITALS — BP 122/76 | HR 86 | Ht 65.0 in | Wt 108.8 lb

## 2024-05-18 DIAGNOSIS — R7303 Prediabetes: Secondary | ICD-10-CM | POA: Diagnosis not present

## 2024-05-18 DIAGNOSIS — E782 Mixed hyperlipidemia: Secondary | ICD-10-CM | POA: Diagnosis not present

## 2024-05-18 DIAGNOSIS — Z1231 Encounter for screening mammogram for malignant neoplasm of breast: Secondary | ICD-10-CM | POA: Diagnosis not present

## 2024-05-18 DIAGNOSIS — I10 Essential (primary) hypertension: Secondary | ICD-10-CM

## 2024-05-18 MED ORDER — ROSUVASTATIN CALCIUM 20 MG PO TABS: 20.0000 mg | ORAL_TABLET | Freq: Every day | ORAL | 1 refills | Status: DC

## 2024-05-18 MED ORDER — CLOPIDOGREL BISULFATE 75 MG PO TABS: 75.0000 mg | ORAL_TABLET | Freq: Every day | ORAL | 3 refills | Status: AC

## 2024-05-18 MED ORDER — GABAPENTIN 300 MG PO CAPS: 300.0000 mg | ORAL_CAPSULE | Freq: Every day | ORAL | 1 refills | Status: DC

## 2024-05-18 NOTE — Progress Notes (Signed)
 Established Patient Office Visit  Subjective:  Patient ID: Beth Ray, female    DOB: 1961-02-23  Age: 63 y.o. MRN: [id number]  Chief Complaint  Patient presents with   Follow-up    4 month follow up    Patient in office for 4 month follow up, discuss recent lab work. Patient doing well, no complaints today. Continues to smoke, not interested in quitting.  Up to date on routine maintenance exams. Pap smear 01/05/21 negative. Due for mammogram 09/2024, order placed. Discussed recent lab work. Hgb A1c stable. LDL at goal.  Continue same medications.     No other concerns at this time.   Past Medical History:  Diagnosis Date   COPD (chronic obstructive pulmonary disease) (HCC)    Hyperlipidemia    Hypertension     Past Surgical History:  Procedure Laterality Date   ABDOMINAL HYSTERECTOMY      Social History   Socioeconomic History   Marital status: Married    Spouse name: Not on file   Number of children: Not on file   Years of education: Not on file   Highest education level: Not on file  Occupational History   Not on file  Tobacco Use   Smoking status: Every Day    Current packs/day: 1.00    Average packs/day: 1 pack/day for 50.5 years (50.5 ttl pk-yrs)    Types: Cigarettes    Start date: 50   Smokeless tobacco: Never  Substance and Sexual Activity   Alcohol use: Not on file   Drug use: Not on file   Sexual activity: Not on file  Other Topics Concern   Not on file  Social History Narrative   Not on file   Social Drivers of Health   Financial Resource Strain: Not on file  Food Insecurity: No Food Insecurity (09/13/2019)   Received from [facility]   Hunger Vital Sign    Within the past 12 months, you worried that your food would run out before you got the money to buy more.: Never true    Within the past 12 months, the food you bought just didn't last and you didn't have money to get more.: Never true  Transportation Needs: Not on file   Physical Activity: Not on file  Stress: Not on file  Social Connections: Not on file  Intimate Partner Violence: Not on file    Family History  Problem Relation Age of Onset   Breast cancer Neg Hx     No Known Allergies  Outpatient Medications Prior to Visit  Medication Sig   Cholecalciferol (VITAMIN D3) 10 MCG (400 UNIT) tablet Take 400 Units by mouth daily.   GARLIC PO Take by mouth.   Turmeric 400 MG CAPS Take by mouth.   Ubrogepant  (UBRELVY ) 100 MG TABS Take 1 tablet (100 mg total) by mouth daily.   valACYclovir (VALTREX) 1000 MG tablet TAKE 2 TABLETS BY MOUTH FOR ONE DOSE AT FIRST SIGN OF COLD SORE AND REPEAT 2 TABLETS 12 HOURS LATER   [DISCONTINUED] clopidogrel  (PLAVIX ) 75 MG tablet TAKE 1 TABLET BY MOUTH DAILY   [DISCONTINUED] gabapentin  (NEURONTIN ) 300 MG capsule TAKE 1 CAPSULE BY MOUTH ONCE DAILY   [DISCONTINUED] rosuvastatin  (CRESTOR ) 20 MG tablet TAKE 1 TABLET BY MOUTH NIGHTLY   No facility-administered medications prior to visit.    Review of Systems  Constitutional: Negative.   HENT: Negative.    Eyes: Negative.   Respiratory: Negative.  Negative for shortness of breath.  Cardiovascular: Negative.  Negative for chest pain.  Gastrointestinal: Negative.  Negative for abdominal pain, constipation and diarrhea.  Genitourinary: Negative.   Musculoskeletal:  Negative for joint pain and myalgias.  Skin: Negative.   Neurological: Negative.  Negative for dizziness and headaches.  Endo/Heme/Allergies: Negative.   All other systems reviewed and are negative.      Objective:   BP 122/76   Pulse 86   Ht 5' 5 (1.651 m)   Wt 108 lb 12.8 oz (49.4 kg)   SpO2 96%   BMI 18.11 kg/m   Vitals:   05/18/24 0855  BP: 122/76  Pulse: 86  Height: 5' 5 (1.651 m)  Weight: 108 lb 12.8 oz (49.4 kg)  SpO2: 96%  BMI (Calculated): 18.11    Physical Exam Vitals and nursing note reviewed.  Constitutional:      Appearance: Normal appearance. She is normal weight.   HENT:     Head: Normocephalic and atraumatic.     Nose: Nose normal.     Mouth/Throat:     Mouth: Mucous membranes are moist.  Eyes:     Extraocular Movements: Extraocular movements intact.     Conjunctiva/sclera: Conjunctivae normal.     Pupils: Pupils are equal, round, and reactive to light.  Cardiovascular:     Rate and Rhythm: Normal rate and regular rhythm.     Pulses: Normal pulses.     Heart sounds: Normal heart sounds.  Pulmonary:     Effort: Pulmonary effort is normal.     Breath sounds: Normal breath sounds.  Abdominal:     General: Abdomen is flat. Bowel sounds are normal.     Palpations: Abdomen is soft.  Musculoskeletal:        General: Normal range of motion.     Cervical back: Normal range of motion.  Skin:    General: Skin is warm and dry.  Neurological:     General: No focal deficit present.     Mental Status: She is alert and oriented to person, place, and time.  Psychiatric:        Mood and Affect: Mood normal.        Behavior: Behavior normal.        Thought Content: Thought content normal.        Judgment: Judgment normal.      No results found for any visits on 05/18/24.  Recent Results (from the past 2160 hours)  Lipid panel     Status: None   Collection Time: 05/15/24  8:10 AM  Result Value Ref Range   Cholesterol, Total 112 100 - 199 mg/dL   Triglycerides 89 0 - 149 mg/dL   HDL 45 >60 mg/dL   VLDL Cholesterol Cal 17 5 - 40 mg/dL   LDL Chol Calc (NIH) 50 0 - 99 mg/dL   Chol/HDL Ratio 2.5 0.0 - 4.4 ratio    Comment:                                   T. Chol/HDL Ratio                                             Men  Women  1/2 Avg.Risk  3.4    3.3                                   Avg.Risk  5.0    4.4                                2X Avg.Risk  9.6    7.1                                3X Avg.Risk 23.4   11.0   CMP14+EGFR     Status: Abnormal   Collection Time: 05/15/24  8:10 AM  Result Value Ref Range    Glucose 105 (H) 70 - 99 mg/dL   BUN 13 8 - 27 mg/dL   Creatinine, Ser 9.27 0.57 - 1.00 mg/dL   eGFR 93 >40 fO/fpw/8.26   BUN/Creatinine Ratio 18 12 - 28   Sodium 139 134 - 144 mmol/L   Potassium 4.2 3.5 - 5.2 mmol/L   Chloride 102 96 - 106 mmol/L   CO2 22 20 - 29 mmol/L   Calcium  9.4 8.7 - 10.3 mg/dL   Total Protein 6.2 6.0 - 8.5 g/dL   Albumin 4.4 3.9 - 4.9 g/dL   Globulin, Total 1.8 1.5 - 4.5 g/dL   Bilirubin Total <9.7 0.0 - 1.2 mg/dL   Alkaline Phosphatase 58 44 - 121 IU/L   AST 15 0 - 40 IU/L   ALT 15 0 - 32 IU/L  TSH     Status: None   Collection Time: 05/15/24  8:10 AM  Result Value Ref Range   TSH 2.470 0.450 - 4.500 uIU/mL  Hemoglobin A1c     Status: Abnormal   Collection Time: 05/15/24  8:10 AM  Result Value Ref Range   Hgb A1c MFr Bld 6.0 (H) 4.8 - 5.6 %    Comment:          Prediabetes: 5.7 - 6.4          Diabetes: >6.4          Glycemic control for adults with diabetes: <7.0    Est. average glucose Bld gHb Est-mCnc 126 mg/dL      Assessment & Plan:  Mammogram order placed. Continue same medications.  Problem List Items Addressed This Visit       Cardiovascular and Mediastinum   Primary hypertension - Primary   Relevant Medications   rosuvastatin  (CRESTOR ) 20 MG tablet     Other   Prediabetes   Mixed hyperlipidemia   Relevant Medications   rosuvastatin  (CRESTOR ) 20 MG tablet   Other Visit Diagnoses       Breast cancer screening by mammogram       Relevant Orders   MM 3D SCREENING MAMMOGRAM BILATERAL BREAST       Return in about 4 months (around 09/18/2024) for fasting labs prior.   Total time spent: 25 minutes  Google, NP  05/18/2024   This document may have been prepared by Dragon Voice Recognition software and as such may include unintentional dictation errors.

## 2024-07-18 ENCOUNTER — Other Ambulatory Visit: Payer: Self-pay | Admitting: Cardiology

## 2024-07-18 MED ORDER — UBRELVY 100 MG PO TABS: 100.0000 mg | ORAL_TABLET | ORAL | 11 refills | Status: AC | PRN

## 2024-09-19 ENCOUNTER — Other Ambulatory Visit

## 2024-09-19 DIAGNOSIS — E782 Mixed hyperlipidemia: Secondary | ICD-10-CM

## 2024-09-19 DIAGNOSIS — I10 Essential (primary) hypertension: Secondary | ICD-10-CM

## 2024-09-19 DIAGNOSIS — R7303 Prediabetes: Secondary | ICD-10-CM

## 2024-09-20 ENCOUNTER — Ambulatory Visit: Payer: Self-pay | Admitting: Cardiology

## 2024-09-20 LAB — CMP14+EGFR
ALT: 14 IU/L (ref 0–32)
AST: 17 IU/L (ref 0–40)
Albumin: 4.6 g/dL (ref 3.9–4.9)
Alkaline Phosphatase: 64 IU/L (ref 49–135)
BUN/Creatinine Ratio: 20 (ref 12–28)
BUN: 13 mg/dL (ref 8–27)
Bilirubin Total: 0.3 mg/dL (ref 0.0–1.2)
CO2: 26 mmol/L (ref 20–29)
Calcium: 9.8 mg/dL (ref 8.7–10.3)
Chloride: 102 mmol/L (ref 96–106)
Creatinine, Ser: 0.64 mg/dL (ref 0.57–1.00)
Globulin, Total: 2 g/dL (ref 1.5–4.5)
Glucose: 104 mg/dL — ABNORMAL HIGH (ref 70–99)
Potassium: 4.4 mmol/L (ref 3.5–5.2)
Sodium: 140 mmol/L (ref 134–144)
Total Protein: 6.6 g/dL (ref 6.0–8.5)
eGFR: 99 mL/min/1.73 (ref >59)

## 2024-09-20 LAB — LIPID PANEL
Chol/HDL Ratio: 2.8 ratio (ref 0.0–4.4)
Cholesterol, Total: 140 mg/dL (ref 100–199)
HDL: 50 mg/dL (ref >39)
LDL Chol Calc (NIH): 68 mg/dL (ref 0–99)
Triglycerides: 125 mg/dL (ref 0–149)
VLDL Cholesterol Cal: 22 mg/dL (ref 5–40)

## 2024-09-20 LAB — HEMOGLOBIN A1C
Est. average glucose Bld gHb Est-mCnc: 126 mg/dL
Hgb A1c MFr Bld: 6 % — ABNORMAL HIGH (ref 4.8–5.6)

## 2024-09-20 LAB — TSH: TSH: 2.81 u[IU]/mL (ref 0.450–4.500)

## 2024-09-21 ENCOUNTER — Ambulatory Visit (INDEPENDENT_AMBULATORY_CARE_PROVIDER_SITE_OTHER): Admitting: Cardiology

## 2024-09-21 ENCOUNTER — Encounter: Payer: Self-pay | Admitting: Cardiology

## 2024-09-21 VITALS — BP 124/68 | HR 98 | Ht 65.0 in | Wt 112.4 lb

## 2024-09-21 DIAGNOSIS — F17219 Nicotine dependence, cigarettes, with unspecified nicotine-induced disorders: Secondary | ICD-10-CM

## 2024-09-21 DIAGNOSIS — E782 Mixed hyperlipidemia: Secondary | ICD-10-CM | POA: Diagnosis not present

## 2024-09-21 DIAGNOSIS — I10 Essential (primary) hypertension: Secondary | ICD-10-CM

## 2024-09-21 DIAGNOSIS — G8929 Other chronic pain: Secondary | ICD-10-CM

## 2024-09-21 DIAGNOSIS — R7303 Prediabetes: Secondary | ICD-10-CM

## 2024-09-21 DIAGNOSIS — R519 Headache, unspecified: Secondary | ICD-10-CM

## 2024-09-21 MED ORDER — VALACYCLOVIR HCL 500 MG PO TABS: 500.0000 mg | ORAL_TABLET | Freq: Every day | ORAL | 3 refills | Status: AC | PRN

## 2024-09-21 MED ORDER — ROSUVASTATIN CALCIUM 20 MG PO TABS: 20.0000 mg | ORAL_TABLET | Freq: Every day | ORAL | 1 refills | Status: AC

## 2024-09-21 MED ORDER — GABAPENTIN 300 MG PO CAPS: 300.0000 mg | ORAL_CAPSULE | Freq: Every day | ORAL | 1 refills | Status: AC

## 2024-09-21 NOTE — Progress Notes (Signed)
 Established Patient Office Visit  Subjective:  Patient ID: Beth Ray, female    DOB: 09-30-1961  Age: 63 y.o. MRN: [id number]  Chief Complaint  Patient presents with   Follow-up    4 month lab results    Patient in office for 4 month follow up, discuss recent lab results. Patient doing well, no complaints today. Has been taking Ubrelvy  daily regardless of headache or not. Recommend taking as needed.  Discussed recent lab work, all stable.  Due for mammogram, order previously placed. Patient to call to schedule.  Cologuard 06/2023 negative.  Patient reports getting the flu vaccine at the pharmacy.  Continue current medications.  Patient continues to smoke, not interested in quitting at this time.     No other concerns at this time.   Past Medical History:  Diagnosis Date   COPD (chronic obstructive pulmonary disease) (HCC)    Hyperlipidemia    Hypertension     Past Surgical History:  Procedure Laterality Date   ABDOMINAL HYSTERECTOMY      Social History   Socioeconomic History   Marital status: Married    Spouse name: Not on file   Number of children: Not on file   Years of education: Not on file   Highest education level: Not on file  Occupational History   Not on file  Tobacco Use   Smoking status: Every Day    Current packs/day: 1.00    Average packs/day: 1 pack/day for 50.9 years (50.9 ttl pk-yrs)    Types: Cigarettes    Start date: 28   Smokeless tobacco: Never  Substance and Sexual Activity   Alcohol use: Not on file   Drug use: Not on file   Sexual activity: Not on file  Other Topics Concern   Not on file  Social History Narrative   Not on file   Social Drivers of Health   Financial Resource Strain: Not on file  Food Insecurity: No Food Insecurity (09/13/2019)   Received from [facility]   Hunger Vital Sign    Within the past 12 months, you worried that your food would run out before you got the money to buy more.: Never true     Within the past 12 months, the food you bought just didn't last and you didn't have money to get more.: Never true  Transportation Needs: Not on file  Physical Activity: Not on file  Stress: Not on file  Social Connections: Not on file  Intimate Partner Violence: Not on file    Family History  Problem Relation Age of Onset   Breast cancer Neg Hx     No Known Allergies  Outpatient Medications Prior to Visit  Medication Sig   Cholecalciferol (VITAMIN D3) 10 MCG (400 UNIT) tablet Take 400 Units by mouth daily.   clopidogrel  (PLAVIX ) 75 MG tablet Take 1 tablet (75 mg total) by mouth daily.   GARLIC PO Take by mouth.   Turmeric 400 MG CAPS Take by mouth.   Ubrogepant  (UBRELVY ) 100 MG TABS Take 1 tablet (100 mg total) by mouth as needed.   [DISCONTINUED] gabapentin  (NEURONTIN ) 300 MG capsule Take 1 capsule (300 mg total) by mouth daily.   [DISCONTINUED] rosuvastatin  (CRESTOR ) 20 MG tablet Take 1 tablet (20 mg total) by mouth at bedtime.   [DISCONTINUED] valACYclovir (VALTREX) 1000 MG tablet TAKE 2 TABLETS BY MOUTH FOR ONE DOSE AT FIRST SIGN OF COLD SORE AND REPEAT 2 TABLETS 12 HOURS LATER   No facility-administered  medications prior to visit.    Review of Systems  Constitutional: Negative.   HENT: Negative.    Eyes: Negative.   Respiratory: Negative.  Negative for shortness of breath.   Cardiovascular: Negative.  Negative for chest pain.  Gastrointestinal: Negative.  Negative for abdominal pain, constipation and diarrhea.  Genitourinary: Negative.   Musculoskeletal:  Negative for joint pain and myalgias.  Skin: Negative.   Neurological: Negative.  Negative for dizziness and headaches.  Endo/Heme/Allergies: Negative.   All other systems reviewed and are negative.      Objective:   BP 124/68   Pulse 98   Ht 5' 5 (1.651 m)   Wt 112 lb 6.4 oz (51 kg)   SpO2 96%   BMI 18.70 kg/m   Vitals:   09/21/24 0902  BP: 124/68  Pulse: 98  Height: 5' 5 (1.651 m)  Weight: 112  lb 6.4 oz (51 kg)  SpO2: 96%  BMI (Calculated): 18.7    Physical Exam Vitals and nursing note reviewed.  Constitutional:      Appearance: Normal appearance. She is normal weight.  HENT:     Head: Normocephalic and atraumatic.     Nose: Nose normal.     Mouth/Throat:     Mouth: Mucous membranes are moist.  Eyes:     Extraocular Movements: Extraocular movements intact.     Conjunctiva/sclera: Conjunctivae normal.     Pupils: Pupils are equal, round, and reactive to light.  Cardiovascular:     Rate and Rhythm: Normal rate and regular rhythm.     Pulses: Normal pulses.     Heart sounds: Normal heart sounds.  Pulmonary:     Effort: Pulmonary effort is normal.     Breath sounds: Normal breath sounds.  Abdominal:     General: Abdomen is flat. Bowel sounds are normal.     Palpations: Abdomen is soft.  Musculoskeletal:        General: Normal range of motion.     Cervical back: Normal range of motion.  Skin:    General: Skin is warm and dry.  Neurological:     General: No focal deficit present.     Mental Status: She is alert and oriented to person, place, and time.  Psychiatric:        Mood and Affect: Mood normal.        Behavior: Behavior normal.        Thought Content: Thought content normal.        Judgment: Judgment normal.      No results found for any visits on 09/21/24.  Recent Results (from the past 2160 hours)  CMP14+EGFR     Status: Abnormal   Collection Time: 09/19/24  8:43 AM  Result Value Ref Range   Glucose 104 (H) 70 - 99 mg/dL   BUN 13 8 - 27 mg/dL   Creatinine, Ser 9.35 0.57 - 1.00 mg/dL   eGFR 99 >40 fO/fpw/8.26   BUN/Creatinine Ratio 20 12 - 28   Sodium 140 134 - 144 mmol/L   Potassium 4.4 3.5 - 5.2 mmol/L   Chloride 102 96 - 106 mmol/L   CO2 26 20 - 29 mmol/L   Calcium  9.8 8.7 - 10.3 mg/dL   Total Protein 6.6 6.0 - 8.5 g/dL   Albumin 4.6 3.9 - 4.9 g/dL   Globulin, Total 2.0 1.5 - 4.5 g/dL   Bilirubin Total 0.3 0.0 - 1.2 mg/dL   Alkaline  Phosphatase 64 49 - 135 IU/L   AST  17 0 - 40 IU/L   ALT 14 0 - 32 IU/L  Lipid panel     Status: None   Collection Time: 09/19/24  8:43 AM  Result Value Ref Range   Cholesterol, Total 140 100 - 199 mg/dL   Triglycerides 874 0 - 149 mg/dL   HDL 50 >60 mg/dL   VLDL Cholesterol Cal 22 5 - 40 mg/dL   LDL Chol Calc (NIH) 68 0 - 99 mg/dL   Chol/HDL Ratio 2.8 0.0 - 4.4 ratio    Comment:                                   T. Chol/HDL Ratio                                             Men  Women                               1/2 Avg.Risk  3.4    3.3                                   Avg.Risk  5.0    4.4                                2X Avg.Risk  9.6    7.1                                3X Avg.Risk 23.4   11.0   Hemoglobin A1c     Status: Abnormal   Collection Time: 09/19/24  8:43 AM  Result Value Ref Range   Hgb A1c MFr Bld 6.0 (H) 4.8 - 5.6 %    Comment:          Prediabetes: 5.7 - 6.4          Diabetes: >6.4          Glycemic control for adults with diabetes: <7.0    Est. average glucose Bld gHb Est-mCnc 126 mg/dL  TSH     Status: None   Collection Time: 09/19/24  8:43 AM  Result Value Ref Range   TSH 2.810 0.450 - 4.500 uIU/mL      Assessment & Plan:  Schedule mammogram Consider quitting smoking Continue current medications  Problem List Items Addressed This Visit       Cardiovascular and Mediastinum   Primary hypertension - Primary   Relevant Medications   rosuvastatin  (CRESTOR ) 20 MG tablet     Other   Chronic nonintractable headache   Relevant Medications   gabapentin  (NEURONTIN ) 300 MG capsule   Prediabetes   Nicotine dependence with nicotine-induced disorder   Mixed hyperlipidemia   Relevant Medications   rosuvastatin  (CRESTOR ) 20 MG tablet    Return in about 4 months (around 01/19/2025) for fasting labs prior.   Total time spent: 25 minutes. This time includes review of previous notes and results and patient face to face interaction during today's visit.     Jeoffrey Pollen, NP  09/21/2024   This document may have  been prepared by [employer] and as such may include unintentional dictation errors.

## 2024-09-21 NOTE — Patient Instructions (Signed)
 [facility] [address], [address] [place],  [place]  [address]  Main: [phone]

## 2024-09-25 ENCOUNTER — Other Ambulatory Visit: Payer: Self-pay | Admitting: Cardiology

## 2025-01-21 ENCOUNTER — Ambulatory Visit: Admitting: Cardiology
